# Patient Record
Sex: Male | Born: 1990 | Race: White | Hispanic: No | Marital: Single | Smoking: Current every day smoker
Health system: Southern US, Community
[De-identification: ages and names within clinical notes are randomized; demographics above are authoritative.]

## PROBLEM LIST (undated history)

## (undated) DIAGNOSIS — F1910 Other psychoactive substance abuse, uncomplicated: Secondary | ICD-10-CM

## (undated) DIAGNOSIS — Z9109 Other allergy status, other than to drugs and biological substances: Secondary | ICD-10-CM

## (undated) DIAGNOSIS — F329 Major depressive disorder, single episode, unspecified: Secondary | ICD-10-CM

## (undated) DIAGNOSIS — F32A Depression, unspecified: Secondary | ICD-10-CM

## (undated) DIAGNOSIS — F419 Anxiety disorder, unspecified: Secondary | ICD-10-CM

---
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
## 2012-04-04 ENCOUNTER — Encounter (HOSPITAL_COMMUNITY): Payer: Self-pay | Admitting: *Deleted

## 2012-04-04 ENCOUNTER — Emergency Department (HOSPITAL_COMMUNITY)
Admission: EM | Admit: 2012-04-04 | Discharge: 2012-04-04 | Disposition: A | Discharge status: Home / Self Care | Payer: Medicaid Other | Attending: Emergency Medicine | Admitting: Emergency Medicine

## 2012-04-04 DIAGNOSIS — IMO0002 Reserved for concepts with insufficient information to code with codable children: Secondary | ICD-10-CM | POA: Insufficient documentation

## 2012-04-04 DIAGNOSIS — J309 Allergic rhinitis, unspecified: Secondary | ICD-10-CM | POA: Insufficient documentation

## 2012-04-04 DIAGNOSIS — L819 Disorder of pigmentation, unspecified: Secondary | ICD-10-CM | POA: Insufficient documentation

## 2012-04-04 DIAGNOSIS — L0390 Cellulitis, unspecified: Secondary | ICD-10-CM

## 2012-04-04 DIAGNOSIS — Z87891 Personal history of nicotine dependence: Secondary | ICD-10-CM | POA: Insufficient documentation

## 2012-04-04 DIAGNOSIS — F411 Generalized anxiety disorder: Secondary | ICD-10-CM | POA: Insufficient documentation

## 2012-04-04 MED ORDER — LIDOCAINE HCL (PF) 1 % IJ SOLN
INTRAMUSCULAR | Status: AC
  Administered 2012-04-04: 16:00:00
  Filled 2012-04-04: qty 5

## 2012-04-04 MED ORDER — IBUPROFEN 800 MG PO TABS
800.0000 mg | ORAL_TABLET | Freq: Once | ORAL | Status: AC
  Administered 2012-04-04: 800 mg via ORAL
  Filled 2012-04-04: qty 1

## 2012-04-04 MED ORDER — SULFAMETHOXAZOLE-TMP DS 800-160 MG PO TABS
1.0000 | ORAL_TABLET | Freq: Once | ORAL | Status: AC
  Administered 2012-04-04: 1 via ORAL
  Filled 2012-04-04: qty 1

## 2012-04-04 MED ORDER — SULFAMETHOXAZOLE-TRIMETHOPRIM 800-160 MG PO TABS: 1.0000 | ORAL_TABLET | Freq: Two times a day (BID) | ORAL | Status: DC

## 2012-04-04 MED ORDER — HYDROCODONE-ACETAMINOPHEN 5-325 MG PO TABS
1.0000 | ORAL_TABLET | Freq: Once | ORAL | Status: AC
  Administered 2012-04-04: 1 via ORAL
  Filled 2012-04-04: qty 1

## 2012-04-04 MED ORDER — CEFTRIAXONE SODIUM 1 G IJ SOLR
1.0000 g | Freq: Once | INTRAMUSCULAR | Status: AC
  Administered 2012-04-04: 1 g via INTRAMUSCULAR
  Filled 2012-04-04: qty 10

## 2012-04-04 MED ORDER — HYDROCODONE-ACETAMINOPHEN 5-325 MG PO TABS: 1.0000 | ORAL_TABLET | ORAL | Status: AC | PRN

## 2012-04-04 MED ORDER — AMOXICILLIN 500 MG PO CAPS: ORAL_CAPSULE | ORAL | Status: DC

## 2012-04-04 NOTE — ED Provider Notes (Signed)
History     CSN: [id number]  Arrival date & time 04/04/12  1350   First MD Initiated Contact with Patient 04/04/12 1502      Chief Complaint  Patient presents with  . Cellulitis    (Consider location/radiation/quality/duration/timing/severity/associated sxs/prior treatment) HPI Comments: Pt had a tattoo placed on the left forearm on Oct 11. No developed a red rash around the site, followed by blistering and increase warmth. He now has redness and pain of the area. The redness extends to the upper arm. No fever or chills. Soreness with use of the left arm, but not limited. No other sites of infection.   Past Medical History  Diagnosis Date  . Anxiety   . Environmental allergies     History reviewed. No pertinent past surgical history.  History reviewed. No pertinent family history.  History  Substance Use Topics  . Smoking status: Former Games developer  . Smokeless tobacco: Not on file  . Alcohol Use: No      Review of Systems  Constitutional: Negative for activity change.       All ROS Neg except as noted in HPI  HENT: Negative for nosebleeds and neck pain.   Eyes: Negative for photophobia and discharge.  Respiratory: Negative for cough, shortness of breath and wheezing.   Cardiovascular: Negative for chest pain and palpitations.  Gastrointestinal: Negative for abdominal pain and blood in stool.  Genitourinary: Negative for dysuria, frequency and hematuria.  Musculoskeletal: Negative for back pain and arthralgias.  Skin: Negative.   Neurological: Negative for dizziness, seizures and speech difficulty.  Psychiatric/Behavioral: Negative for hallucinations and confusion. The patient is nervous/anxious.     Allergies  Review of patient's allergies indicates no known allergies.  Home Medications   Current Outpatient Rx  Name Route Sig Dispense Refill  . CETIRIZINE HCL 10 MG PO TABS Oral Take 10 mg by mouth every evening.    . IBUPROFEN 200 MG PO TABS Oral Take 200 mg  by mouth every 6 (six) hours as needed. For pain    . VENLAFAXINE HCL ER 150 MG PO CP24 Oral Take 150 mg by mouth daily.    . AMOXICILLIN 500 MG PO CAPS  2 po bid with food 28 capsule 0  . HYDROCODONE-ACETAMINOPHEN 5-325 MG PO TABS Oral Take 1 tablet by mouth every 4 (four) hours as needed for pain. 20 tablet 0  . SULFAMETHOXAZOLE-TRIMETHOPRIM 800-160 MG PO TABS Oral Take 1 tablet by mouth every 12 (twelve) hours. 14 tablet 0    BP 129/54  Pulse 94  Temp 98.6 F (37 C) (Oral)  Resp 18  Ht 6\' 2"  (1.88 m)  Wt 164 lb (74.39 kg)  BMI 21.06 kg/m2  SpO2 98%  Physical Exam  Nursing note and vitals reviewed. Constitutional: He is oriented to person, place, and time. He appears well-developed and well-nourished.  Non-toxic appearance.  HENT:  Head: Normocephalic.  Right Ear: Tympanic membrane and external ear normal.  Left Ear: Tympanic membrane and external ear normal.  Eyes: EOM and lids are normal. Pupils are equal, round, and reactive to light.  Neck: Normal range of motion. Neck supple. Carotid bruit is not present.  Cardiovascular: Normal rate, regular rhythm, normal heart sounds, intact distal pulses and normal pulses.   Pulmonary/Chest: Breath sounds normal. No respiratory distress.  Abdominal: Soft. Bowel sounds are normal. There is no tenderness. There is no guarding.  Musculoskeletal: Normal range of motion.       Increased redness from the left wrist up  to the antecubital area. Some mild red streaking extending from the antecubital area. The form is warm but not hot. There is no palpable nodes in the axilla. There is full range of motion of the fingers and wrist and elbow.  Lymphadenopathy:       Head (right side): No submandibular adenopathy present.       Head (left side): No submandibular adenopathy present.    He has no cervical adenopathy.  Neurological: He is alert and oriented to person, place, and time. He has normal strength. No cranial nerve deficit or sensory deficit.   Skin: Skin is warm and dry.  Psychiatric: He has a normal mood and affect. His speech is normal.    ED Course  Procedures (including critical care time)  Labs Reviewed - No data to display No results found.   1. Cellulitis       MDM  I have reviewed nursing notes, vital signs, and all appropriate lab and imaging results for this patient. kExam is consistent with cellulitis of the left forearm. Pt advised to use warm soaks to the forearm. Rx for septra, Amoxil and norco given to the patient. Pt to see PCP or return to ED in 3 days for recheck. He is to return sooner if any changes or problem.       Kathie Dike, [place] 04/08/12 [phone]

## 2012-04-04 NOTE — ED Notes (Signed)
Pt has a tattoo to lt forearm  On 10/11.  For last 2 days redness and pain extending to upper arm.

## 2012-04-04 NOTE — ED Notes (Signed)
Pt with new tattoo 9 days ago, also used new body wash Thursday, has redness to arm above tattoo, pt states two pimple like areas noted yesterday, c/o pain to left forearm where tattoo was placed

## 2012-04-08 NOTE — ED Provider Notes (Signed)
Medical screening examination/treatment/procedure(s) were performed by non-physician practitioner and as supervising physician I was immediately available for consultation/collaboration.  Jones Skene, M.D.     Jones Skene, MD 04/08/12 1046

## 2012-05-06 ENCOUNTER — Encounter (HOSPITAL_COMMUNITY): Payer: Self-pay | Admitting: *Deleted

## 2012-05-06 ENCOUNTER — Emergency Department (HOSPITAL_COMMUNITY)
Admission: EM | Admit: 2012-05-06 | Discharge: 2012-05-06 | Disposition: A | Discharge status: Home / Self Care | Payer: Self-pay | Attending: Emergency Medicine | Admitting: Emergency Medicine

## 2012-05-06 DIAGNOSIS — H9209 Otalgia, unspecified ear: Secondary | ICD-10-CM | POA: Insufficient documentation

## 2012-05-06 DIAGNOSIS — Z87891 Personal history of nicotine dependence: Secondary | ICD-10-CM | POA: Insufficient documentation

## 2012-05-06 DIAGNOSIS — J029 Acute pharyngitis, unspecified: Secondary | ICD-10-CM | POA: Insufficient documentation

## 2012-05-06 DIAGNOSIS — F411 Generalized anxiety disorder: Secondary | ICD-10-CM | POA: Insufficient documentation

## 2012-05-06 DIAGNOSIS — Z79899 Other long term (current) drug therapy: Secondary | ICD-10-CM | POA: Insufficient documentation

## 2012-05-06 LAB — RAPID STREP SCREEN (MED CTR MEBANE ONLY): Streptococcus, Group A Screen (Direct): NEGATIVE

## 2012-05-06 MED ORDER — IBUPROFEN 800 MG PO TABS
800.0000 mg | ORAL_TABLET | Freq: Once | ORAL | Status: AC
  Administered 2012-05-06: 800 mg via ORAL
  Filled 2012-05-06: qty 1

## 2012-05-06 NOTE — ED Notes (Signed)
Pt reports being sick for last few days with sore throat and right earache. Hard to swallow at times/

## 2012-05-06 NOTE — ED Notes (Signed)
Pt c/o sore throat, right ear pain for the past few days, painful to swallow.

## 2012-05-06 NOTE — ED Provider Notes (Signed)
History    This chart was scribed for Duane Octave, MD, MD by Smitty Pluck, ED Scribe. The patient was seen in room APA01 and the patient's care was started at 10:01AM.   CSN: [id number]  Arrival date & time 05/06/12  4098      Chief Complaint  Patient presents with  . Sore Throat  . Otalgia    (Consider location/radiation/quality/duration/timing/severity/associated sxs/prior treatment) The history is provided by the patient. No language interpreter was used.   TRENDEN Duane Parker is a 21 y.o. male who presents to the Emergency Department complaining of constant, moderate sore throat onset 1 day ago. He mentions swallowing food and liquids aggravates throat pain. Pt reports having right otalgia onset today. Pt states that he had viral infection over past week but thought symptoms subsided. He reports mild cough. Denies fevers, chills, nausea, vomiting, diarrhea, rash, drainage from right ear and any other pain. Pt has taken tylenol without relief.   Past Medical History  Diagnosis Date  . Anxiety   . Environmental allergies     History reviewed. No pertinent past surgical history.  No family history on file.  History  Substance Use Topics  . Smoking status: Former Games developer  . Smokeless tobacco: Not on file  . Alcohol Use: No      Review of Systems  All other systems reviewed and are negative.  10 Systems reviewed and all are negative for acute change except as noted in the HPI.    Allergies  Review of patient's allergies indicates no known allergies.  Home Medications   Current Outpatient Rx  Name  Route  Sig  Dispense  Refill  . CETIRIZINE HCL 10 MG PO TABS   Oral   Take 10 mg by mouth every evening.         . IBUPROFEN 200 MG PO TABS   Oral   Take 600 mg by mouth every 8 (eight) hours as needed. For pain         . VENLAFAXINE HCL ER 150 MG PO CP24   Oral   Take 150 mg by mouth daily.         . AMOXICILLIN 500 MG PO CAPS      2 po bid with  food   28 capsule   0   . SULFAMETHOXAZOLE-TRIMETHOPRIM 800-160 MG PO TABS   Oral   Take 1 tablet by mouth every 12 (twelve) hours.   14 tablet   0     BP 119/66  Pulse 110  Temp 97.6 F (36.4 C)  Resp 18  SpO2 100%  Physical Exam  Nursing note and vitals reviewed. Constitutional: He is oriented to person, place, and time. He appears well-developed and well-nourished. No distress.  HENT:  Head: Normocephalic and atraumatic.  Right Ear: External ear normal.  Left Ear: External ear normal.  Mouth/Throat: No oropharyngeal exudate.       Mild erythema of oropharynx No tragus or mastoid pain Uvula midline No lymphadenopathy     Neck: Normal range of motion. Neck supple.  Cardiovascular: Normal rate, regular rhythm and normal heart sounds.   Pulmonary/Chest: Effort normal and breath sounds normal. No respiratory distress.  Abdominal: Soft. He exhibits no distension. There is no tenderness. There is no rebound and no guarding.  Musculoskeletal: Normal range of motion.  Neurological: He is alert and oriented to person, place, and time.  Skin: Skin is warm and dry.  Psychiatric: He has a normal mood and affect. His behavior  is normal.    ED Course  Procedures (including critical care time) DIAGNOSTIC STUDIES: Oxygen Saturation is 100% on room air, normal by my interpretation.    COORDINATION OF CARE: 10:04 AM Discussed ED treatment with pt  10:08 AM Ordered:   Medications  ibuprofen (ADVIL,MOTRIN) tablet 800 mg (800 mg Oral Given 05/06/12 1022)   10:45AM Recheck. Discussed lab results with pt. Pt is feeling better.      Labs Reviewed  RAPID STREP SCREEN   No results found.   1. Pharyngitis       MDM  Patient complains of right-sided ear pain and sore throat for the past 2 days. Pain with swallowing but no difficulty. No difficulty breathing. No fever.  No asymmetry or exudates on exam. Tympanic membranes normal. Tolerating PO. Will treat for viral  pharyngitis.      I personally performed the services described in this documentation, which was scribed in my presence. The recorded information has been reviewed and is accurate.  Duane Octave, MD 05/06/12 [phone]

## 2012-08-26 ENCOUNTER — Encounter (HOSPITAL_COMMUNITY): Payer: Self-pay | Admitting: *Deleted

## 2012-08-26 ENCOUNTER — Emergency Department (HOSPITAL_COMMUNITY)
Admission: EM | Admit: 2012-08-26 | Discharge: 2012-08-26 | Disposition: A | Discharge status: Home / Self Care | Payer: Self-pay | Attending: Emergency Medicine | Admitting: Emergency Medicine

## 2012-08-26 DIAGNOSIS — F329 Major depressive disorder, single episode, unspecified: Secondary | ICD-10-CM | POA: Insufficient documentation

## 2012-08-26 DIAGNOSIS — F3289 Other specified depressive episodes: Secondary | ICD-10-CM | POA: Insufficient documentation

## 2012-08-26 DIAGNOSIS — Z79899 Other long term (current) drug therapy: Secondary | ICD-10-CM | POA: Insufficient documentation

## 2012-08-26 DIAGNOSIS — F32A Depression, unspecified: Secondary | ICD-10-CM

## 2012-08-26 DIAGNOSIS — Z87891 Personal history of nicotine dependence: Secondary | ICD-10-CM | POA: Insufficient documentation

## 2012-08-26 DIAGNOSIS — F411 Generalized anxiety disorder: Secondary | ICD-10-CM | POA: Insufficient documentation

## 2012-08-26 HISTORY — DX: Major depressive disorder, single episode, unspecified: F32.9

## 2012-08-26 HISTORY — DX: Depression, unspecified: F32.A

## 2012-08-26 NOTE — ED Notes (Signed)
Out of med for depression.  X 1 week.  Needs med til he can get appt with MD.

## 2012-08-26 NOTE — ED Notes (Signed)
Alert, NAD,  Out of effexor for 1 week, does not have money to get appt for eval with MD for refill.  Parents are here with him.  Denies si or hi

## 2012-08-26 NOTE — ED Provider Notes (Signed)
History     CSN: [id number]  Arrival date & time 08/26/12  1159   First MD Initiated Contact with Patient 08/26/12 1513      Chief Complaint  Patient presents with  . Medication Refill    (Consider location/radiation/quality/duration/timing/severity/associated sxs/prior treatment) HPI Comments: Patient is a 22 year old male who is treated for anxiety and depression. He has been previously being seen by Dr. Milford Cage. Patient presents to the emergency department to obtain a prescription for his depression medication. He has not discussed the need for a prescription with his [facility]. The patient states it has been one to one and a half weeks since his receiving his last dose of medication because of lack of insurance. He denies any suicidal or homicidal ideation. He denies any other psychiatric related issues.   The history is provided by the patient.    Past Medical History  Diagnosis Date  . Anxiety   . Environmental allergies   . Depression     History reviewed. No pertinent past surgical history.  History reviewed. No pertinent family history.  History  Substance Use Topics  . Smoking status: Former Games developer  . Smokeless tobacco: Not on file  . Alcohol Use: No      Review of Systems  Constitutional: Negative for activity change.       All ROS Neg except as noted in HPI  HENT: Negative for nosebleeds and neck pain.   Eyes: Negative for photophobia and discharge.  Respiratory: Negative for cough, shortness of breath and wheezing.   Cardiovascular: Negative for chest pain and palpitations.  Gastrointestinal: Negative for abdominal pain and blood in stool.  Genitourinary: Negative for dysuria, frequency and hematuria.  Musculoskeletal: Negative for back pain and arthralgias.  Skin: Negative.   Neurological: Negative for dizziness, seizures and speech difficulty.  Psychiatric/Behavioral: Negative for hallucinations and confusion. The  patient is nervous/anxious.        Depression    Allergies  Review of patient's allergies indicates no known allergies.  Home Medications   Current Outpatient Rx  Name  Route  Sig  Dispense  Refill  . cetirizine (ZYRTEC) 10 MG tablet   Oral   Take 10 mg by mouth every evening.         Marland Kitchen ibuprofen (ADVIL,MOTRIN) 200 MG tablet   Oral   Take 600 mg by mouth every 8 (eight) hours as needed. For pain         . venlafaxine XR (EFFEXOR-XR) 150 MG 24 hr capsule   Oral   Take 150 mg by mouth daily.           BP 129/77  Pulse 91  Temp(Src) 98.1 F (36.7 C) (Oral)  Ht 6\' 3"  (1.905 m)  Wt 168 lb (76.204 kg)  BMI 21 kg/m2  SpO2 100%  Physical Exam  Nursing note and vitals reviewed. Constitutional: He is oriented to person, place, and time. He appears well-developed and well-nourished.  Non-toxic appearance.  HENT:  Head: Normocephalic.  Right Ear: Tympanic membrane and external ear normal.  Left Ear: Tympanic membrane and external ear normal.  Eyes: EOM and lids are normal. Pupils are equal, round, and reactive to light.  Neck: Normal range of motion. Neck supple. Carotid bruit is not present.  Cardiovascular: Normal rate, regular rhythm, normal heart sounds, intact distal pulses and normal pulses.   Pulmonary/Chest: Breath sounds normal. No respiratory distress.  Abdominal: Soft. Bowel sounds are normal. There is no tenderness. There is no  guarding.  Musculoskeletal: Normal range of motion.  Lymphadenopathy:       Head (right side): No submandibular adenopathy present.       Head (left side): No submandibular adenopathy present.    He has no cervical adenopathy.  Neurological: He is alert and oriented to person, place, and time. He has normal strength. No cranial nerve deficit or sensory deficit.  Skin: Skin is warm and dry.  Psychiatric: He has a normal mood and affect. His speech is normal.  Patient denies suicidal ideation or homicidal ideation or plan.    ED  Course  Procedures (including critical care time)  Labs Reviewed - No data to display No results found.   No diagnosis found.    MDM  I have reviewed nursing notes, vital signs, and all appropriate lab and imaging results for this patient. Patient presents to the emergency department with complaint of being out of his depression medication (Effexor) the patient states he came to the emergency department to see if he can get some samples, or to get a prescription for this particular medication. I've explained to the patient that this type of prescription would need to come from his primary physician or the physicians at day [facility]. The patient denies any suicidal, homicidal ideations. He denies any suicidal or homicidal plans.       Kathie Dike, PA-C 08/26/12 1529

## 2012-08-28 NOTE — ED Provider Notes (Signed)
Medical screening examination/treatment/procedure(s) were performed by non-physician practitioner and as supervising physician I was immediately available for consultation/collaboration.   Laray Anger, DO 08/28/12 1208

## 2012-09-23 ENCOUNTER — Other Ambulatory Visit: Payer: Self-pay

## 2012-09-23 MED ORDER — FLUOXETINE HCL 20 MG PO CAPS: 20.0000 mg | ORAL_CAPSULE | Freq: Every day | ORAL | Status: DC

## 2013-02-22 ENCOUNTER — Emergency Department (HOSPITAL_COMMUNITY)
Admission: EM | Admit: 2013-02-22 | Discharge: 2013-02-23 | Disposition: A | Discharge status: Home / Self Care | Payer: Medicaid Other | Attending: Emergency Medicine | Admitting: Emergency Medicine

## 2013-02-22 ENCOUNTER — Encounter (HOSPITAL_COMMUNITY): Payer: Self-pay | Admitting: Emergency Medicine

## 2013-02-22 DIAGNOSIS — K089 Disorder of teeth and supporting structures, unspecified: Secondary | ICD-10-CM | POA: Insufficient documentation

## 2013-02-22 DIAGNOSIS — F329 Major depressive disorder, single episode, unspecified: Secondary | ICD-10-CM | POA: Insufficient documentation

## 2013-02-22 DIAGNOSIS — Z79899 Other long term (current) drug therapy: Secondary | ICD-10-CM | POA: Insufficient documentation

## 2013-02-22 DIAGNOSIS — R51 Headache: Secondary | ICD-10-CM | POA: Insufficient documentation

## 2013-02-22 DIAGNOSIS — Z87891 Personal history of nicotine dependence: Secondary | ICD-10-CM | POA: Insufficient documentation

## 2013-02-22 DIAGNOSIS — K0889 Other specified disorders of teeth and supporting structures: Secondary | ICD-10-CM

## 2013-02-22 DIAGNOSIS — F3289 Other specified depressive episodes: Secondary | ICD-10-CM | POA: Insufficient documentation

## 2013-02-22 DIAGNOSIS — F411 Generalized anxiety disorder: Secondary | ICD-10-CM | POA: Insufficient documentation

## 2013-02-22 MED ORDER — NAPROXEN 500 MG PO TABS: 500.0000 mg | ORAL_TABLET | Freq: Two times a day (BID) | ORAL | Status: DC

## 2013-02-22 MED ORDER — HYDROCODONE-ACETAMINOPHEN 5-325 MG PO TABS
1.0000 | ORAL_TABLET | Freq: Once | ORAL | Status: AC
  Administered 2013-02-22: 1 via ORAL
  Filled 2013-02-22: qty 1

## 2013-02-22 MED ORDER — PENICILLIN V POTASSIUM 500 MG PO TABS: 500.0000 mg | ORAL_TABLET | Freq: Four times a day (QID) | ORAL | Status: DC

## 2013-02-22 MED ORDER — HYDROCODONE-ACETAMINOPHEN 5-325 MG PO TABS: 1.0000 | ORAL_TABLET | Freq: Four times a day (QID) | ORAL | Status: DC | PRN

## 2013-02-22 MED ORDER — PENICILLIN V POTASSIUM 250 MG PO TABS
500.0000 mg | ORAL_TABLET | Freq: Once | ORAL | Status: AC
  Administered 2013-02-22: 500 mg via ORAL
  Filled 2013-02-22: qty 2

## 2013-02-22 NOTE — ED Provider Notes (Signed)
CSN: [id number]     Arrival date & time 02/22/13  2302 History  This chart was scribed for Shelda Jakes, MD by Greggory Stallion, ED Scribe. This patient was seen in room APA09/APA09 and the patient's care was started at 11:37 PM.   Chief Complaint  Patient presents with  . Dental Pain   Patient is a 22 y.o. male presenting with tooth pain. The history is provided by the patient. No language interpreter was used.  Dental Pain Location:  Upper Upper teeth location:  1/RU 3rd molar and 2/RU 2nd molar Severity:  Moderate Onset quality:  Gradual Duration:  2 days Timing:  Intermittent Progression:  Unchanged Chronicity:  New Relieved by:  None tried Worsened by:  Nothing tried Ineffective treatments:  None tried Associated symptoms: headaches   Associated symptoms: no fever   Risk factors: lack of dental care     HPI Comments: Duane Parker is a 22 y.o. male who presents to the Emergency Department complaining of intermittent right, upper dental pain with associated headache that started 2-3 days ago. Pt denies fever, chills, cough, rhinorrhea, sore throat, CP, SOB, abdominal pain, nausea, emesis, diarrhea, dysuria, ankle swelling, neck pain, back pain, rash, visual disturbance and bleeding easily.    Past Medical History  Diagnosis Date  . Anxiety   . Environmental allergies   . Depression    History reviewed. No pertinent past surgical history. No family history on file. History  Substance Use Topics  . Smoking status: Former Games developer  . Smokeless tobacco: Not on file  . Alcohol Use: No    Review of Systems  Constitutional: Negative for fever and chills.  HENT: Positive for dental problem. Negative for sore throat, rhinorrhea and trouble swallowing.   Eyes: Negative for redness and visual disturbance.  Respiratory: Negative for cough and shortness of breath.   Cardiovascular: Negative for chest pain.  Gastrointestinal: Negative for nausea, vomiting, abdominal pain  and diarrhea.  Genitourinary: Negative for dysuria.  Musculoskeletal: Negative for joint swelling.  Skin: Negative for rash.  Neurological: Positive for headaches.  Hematological: Does not bruise/bleed easily.  Psychiatric/Behavioral: Negative for confusion.    Allergies  Review of patient's allergies indicates no known allergies.  Home Medications   Current Outpatient Rx  Name  Route  Sig  Dispense  Refill  . cetirizine (ZYRTEC) 10 MG tablet   Oral   Take 10 mg by mouth every evening.         Marland Kitchen FLUoxetine (PROZAC) 20 MG capsule   Oral   Take 1 capsule (20 mg total) by mouth daily.   30 capsule   2   . ibuprofen (ADVIL,MOTRIN) 200 MG tablet   Oral   Take 600 mg by mouth every 8 (eight) hours as needed. For pain         . HYDROcodone-acetaminophen (NORCO/VICODIN) 5-325 MG per tablet   Oral   Take 1-2 tablets by mouth every 6 (six) hours as needed for pain.   14 tablet   0   . naproxen (NAPROSYN) 500 MG tablet   Oral   Take 1 tablet (500 mg total) by mouth 2 (two) times daily.   14 tablet   0   . penicillin v potassium (VEETID) 500 MG tablet   Oral   Take 1 tablet (500 mg total) by mouth 4 (four) times daily.   40 tablet   0   . venlafaxine XR (EFFEXOR-XR) 150 MG 24 hr capsule   Oral  Take 150 mg by mouth daily.          BP 152/85  Pulse 94  Temp(Src) 97.9 F (36.6 C) (Oral)  Resp 24  Ht 6\' 2"  (1.88 m)  Wt 170 lb (77.111 kg)  BMI 21.82 kg/m2  SpO2 100%  Physical Exam  Nursing note and vitals reviewed. Constitutional: He is oriented to person, place, and time. He appears well-developed and well-nourished. No distress.  HENT:  Head: Normocephalic and atraumatic.  Last two upper molars have a lot of decay. No obvious gum swelling. No facial swelling.   Eyes: EOM are normal.  Sclera clear.  Neck: Neck supple. No tracheal deviation present.  Cardiovascular: Normal rate.   Pulmonary/Chest: Effort normal. No respiratory distress.   Musculoskeletal: Normal range of motion.  No swelling in ankles.   Lymphadenopathy:    He has no cervical adenopathy.  Neurological: He is alert and oriented to person, place, and time. No cranial nerve deficit.  Skin: Skin is warm and dry.  Psychiatric: He has a normal mood and affect. His behavior is normal.    ED Course  Procedures (including critical care time)  DIAGNOSTIC STUDIES: Oxygen Saturation is 100% on RA, normal by my interpretation.    COORDINATION OF CARE: 11:45 PM-Discussed treatment plan which includes antibiotic and pain medications with pt at bedside and pt agreed to plan.   Labs Review Labs Reviewed - No data to display Imaging Review No results found.  MDM   1. Toothache    Patient with significant decay in the left upper molars. No significant discharge no significant gum swelling. No facial swelling. No adenopathy.    I personally performed the services described in this documentation, which was scribed in my presence. The recorded information has been reviewed and is accurate.    Shelda Jakes, MD 02/22/13 [phone]

## 2013-02-22 NOTE — ED Notes (Signed)
Pt c/o dental pain x 2 days.  

## 2013-02-23 NOTE — ED Notes (Signed)
Pt alert & oriented x4, stable gait. Patient given discharge instructions, paperwork & prescription(s). Patient  instructed to stop at the registration desk to finish any additional paperwork. Patient verbalized understanding. Pt left department w/ no further questions. 

## 2014-01-06 ENCOUNTER — Emergency Department (HOSPITAL_COMMUNITY)
Admission: EM | Admit: 2014-01-06 | Discharge: 2014-01-06 | Disposition: A | Discharge status: Home / Self Care | Payer: Medicaid Other | Attending: Emergency Medicine | Admitting: Emergency Medicine

## 2014-01-06 ENCOUNTER — Encounter (HOSPITAL_COMMUNITY): Payer: Self-pay | Admitting: Emergency Medicine

## 2014-01-06 DIAGNOSIS — Z9109 Other allergy status, other than to drugs and biological substances: Secondary | ICD-10-CM | POA: Insufficient documentation

## 2014-01-06 DIAGNOSIS — Z79899 Other long term (current) drug therapy: Secondary | ICD-10-CM | POA: Insufficient documentation

## 2014-01-06 DIAGNOSIS — K089 Disorder of teeth and supporting structures, unspecified: Secondary | ICD-10-CM | POA: Insufficient documentation

## 2014-01-06 DIAGNOSIS — Z8659 Personal history of other mental and behavioral disorders: Secondary | ICD-10-CM | POA: Insufficient documentation

## 2014-01-06 DIAGNOSIS — K044 Acute apical periodontitis of pulpal origin: Secondary | ICD-10-CM | POA: Insufficient documentation

## 2014-01-06 DIAGNOSIS — Z792 Long term (current) use of antibiotics: Secondary | ICD-10-CM | POA: Insufficient documentation

## 2014-01-06 DIAGNOSIS — Z87891 Personal history of nicotine dependence: Secondary | ICD-10-CM | POA: Insufficient documentation

## 2014-01-06 DIAGNOSIS — K047 Periapical abscess without sinus: Secondary | ICD-10-CM | POA: Insufficient documentation

## 2014-01-06 MED ORDER — AMOXICILLIN 500 MG PO CAPS
500.0000 mg | ORAL_CAPSULE | Freq: Three times a day (TID) | ORAL | Status: AC
Start: 2014-01-06 — End: 2014-01-16

## 2014-01-06 MED ORDER — TRAMADOL HCL 50 MG PO TABS: 50.0000 mg | ORAL_TABLET | Freq: Four times a day (QID) | ORAL | Status: DC | PRN

## 2014-01-06 NOTE — Discharge Instructions (Signed)
Dental Abscess A dental abscess is a collection of infected fluid (pus) from a bacterial infection in the inner part of the tooth (pulp). It usually occurs at the end of the tooth's root.  CAUSES   Severe tooth decay.  Trauma to the tooth that allows bacteria to enter into the pulp, such as a broken or chipped tooth. SYMPTOMS   Severe pain in and around the infected tooth.  Swelling and redness around the abscessed tooth or in the mouth or face.  Tenderness.  Pus drainage.  Bad breath.  Bitter taste in the mouth.  Difficulty swallowing.  Difficulty opening the mouth.  Nausea.  Vomiting.  Chills.  Swollen neck glands. DIAGNOSIS   A medical and dental history will be taken.  An examination will be performed by tapping on the abscessed tooth.  X-rays may be taken of the tooth to identify the abscess. TREATMENT The goal of treatment is to eliminate the infection. You may be prescribed antibiotic medicine to stop the infection from spreading. A root canal may be performed to save the tooth. If the tooth cannot be saved, it may be pulled (extracted) and the abscess may be drained.  HOME CARE INSTRUCTIONS  Only take over-the-counter or prescription medicines for pain, fever, or discomfort as directed by your caregiver.  Rinse your mouth (gargle) often with salt water ( tsp salt in 8 oz [250 ml] of warm water) to relieve pain or swelling.  Do not drive after taking pain medicine (narcotics).  Do not apply heat to the outside of your face.  Return to your dentist for further treatment as directed. SEEK MEDICAL CARE IF:  Your pain is not helped by medicine.  Your pain is getting worse instead of better. SEEK IMMEDIATE MEDICAL CARE IF:  You have a fever or persistent symptoms for more than 2-3 days.  You have a fever and your symptoms suddenly get worse.  You have chills or a very bad headache.  You have problems breathing or swallowing.  You have trouble  opening your mouth.  You have swelling in the neck or around the eye. Document Released: 06/01/2005 Document Revised: 02/24/2012 Document Reviewed: 09/09/2010 [facility] Patient Information 2015 [place], [place]. This information is not intended to replace advice given to you by your health care provider. Make sure you discuss any questions you have with your health care provider.   Complete your entire course of antibiotics as prescribed.  You  may use the ultram for pain relief but do not drive within 4 hours of taking as this will make you drowsy.  Continue taking your ibuprofen (advil).   Avoid applying heat or ice to this abscess area which can worsen your symptoms.  You may use warm salt water swish and spit treatment or half peroxide and water swish and spit after meals to keep this area clean as discussed.  Call the dentist listed above for further management of your symptoms.

## 2014-01-06 NOTE — ED Notes (Signed)
Patient with no complaints at this time. Respirations even and unlabored. Skin warm/dry. Discharge instructions reviewed with patient at this time. Patient given opportunity to voice concerns/ask questions. Patient discharged at this time and left Emergency Department with steady gait.   

## 2014-01-06 NOTE — ED Notes (Signed)
Pt c/o left side dental pain and facial swelling.

## 2014-01-07 NOTE — ED Provider Notes (Signed)
CSN: [id number]     Arrival date & time 01/06/14  1340 History   First MD Initiated Contact with Patient 01/06/14 1518     Chief Complaint  Patient presents with  . Dental Pain     (Consider location/radiation/quality/duration/timing/severity/associated sxs/prior Treatment) The history is provided by the patient.   [name] is a 23 y.o. male presenting with dental pain and gingival swelling.  He reports having a fractured tooth which is chronic,  But fractured further about 1 month ago. He is controlling the pain fairly with ibuprofen but is concerned about developing gingival swelling around the tooth.  He has not contacted a dentist due to lack of insurance.  He denies fevers or chills and has been able to eat and drink,  Denies throat pain or swelling.     Past Medical History  Diagnosis Date  . Anxiety   . Environmental allergies   . Depression    History reviewed. No pertinent past surgical history. History reviewed. No pertinent family history. History  Substance Use Topics  . Smoking status: Former Games developermoker  . Smokeless tobacco: Not on file  . Alcohol Use: No    Review of Systems  Constitutional: Negative for fever.  HENT: Positive for dental problem. Negative for facial swelling and sore throat.   Respiratory: Negative for shortness of breath.   Musculoskeletal: Negative for neck pain and neck stiffness.      Allergies  Review of patient's allergies indicates no known allergies.  Home Medications   Prior to Admission medications   Medication Sig Start Date End Date Taking? Authorizing Provider  cetirizine (ZYRTEC) 10 MG tablet Take 10 mg by mouth every evening.   Yes Historical Provider, MD  ibuprofen (ADVIL,MOTRIN) 200 MG tablet Take 600 mg by mouth every 8 (eight) hours as needed. For pain   Yes Historical Provider, MD  amoxicillin (AMOXIL) 500 MG capsule Take 1 capsule (500 mg total) by mouth 3 (three) times daily. 01/06/14 01/16/14  Burgess AmorJulie Emali Heyward, PA-C    traMADol (ULTRAM) 50 MG tablet Take 1 tablet (50 mg total) by mouth every 6 (six) hours as needed. 01/06/14   Burgess AmorJulie Tynan Boesel, PA-C   BP 117/64  Pulse 84  Temp(Src) 98.1 F (36.7 C) (Oral)  Resp 18  Ht 6\' 2"  (1.88 m)  SpO2 99% Physical Exam  Constitutional: He is oriented to person, place, and time. He appears well-developed and well-nourished. No distress.  HENT:  Head: Normocephalic and atraumatic.  Right Ear: Tympanic membrane and external ear normal.  Left Ear: Tympanic membrane and external ear normal.  Mouth/Throat: Oropharynx is clear and moist and mucous membranes are normal. No oral lesions. No trismus in the jaw. Dental abscesses present.    Eyes: Conjunctivae are normal.  Neck: Normal range of motion. Neck supple.  Cardiovascular: Normal rate and normal heart sounds.   Pulmonary/Chest: Effort normal.  Musculoskeletal: Normal range of motion.  Lymphadenopathy:    He has no cervical adenopathy.  Neurological: He is alert and oriented to person, place, and time.  Skin: Skin is warm and dry. No erythema.  Psychiatric: He has a normal mood and affect.    ED Course  Procedures (including critical care time) Labs Review Labs Reviewed - No data to display  Imaging Review No results found.   EKG Interpretation None      MDM   Final diagnoses:  Dental infection    Amoxil,  Tramadol,  Continue ibuprofen.  Referral to dentist given.  The patient  appears reasonably screened and/or stabilized for discharge and I doubt any other medical condition or other [facility] requiring further screening, evaluation, or treatment in the ED at this time prior to discharge.     Burgess Amor, PA-C 01/07/14 [phone]

## 2014-01-09 NOTE — ED Provider Notes (Signed)
Medical screening examination/treatment/procedure(s) were performed by non-physician practitioner and as supervising physician I was immediately available for consultation/collaboration.   EKG Interpretation None       Donnetta HutchingBrian Ledarius Leeson, MD 01/09/14 1351

## 2014-01-22 ENCOUNTER — Encounter (HOSPITAL_COMMUNITY): Payer: Self-pay | Admitting: Emergency Medicine

## 2014-01-22 ENCOUNTER — Emergency Department (HOSPITAL_COMMUNITY)
Admission: EM | Admit: 2014-01-22 | Discharge: 2014-01-22 | Disposition: A | Discharge status: Home / Self Care | Payer: Medicaid Other | Attending: Emergency Medicine | Admitting: Emergency Medicine

## 2014-01-22 DIAGNOSIS — J3489 Other specified disorders of nose and nasal sinuses: Secondary | ICD-10-CM | POA: Insufficient documentation

## 2014-01-22 DIAGNOSIS — J309 Allergic rhinitis, unspecified: Secondary | ICD-10-CM | POA: Insufficient documentation

## 2014-01-22 DIAGNOSIS — Z79899 Other long term (current) drug therapy: Secondary | ICD-10-CM | POA: Insufficient documentation

## 2014-01-22 DIAGNOSIS — Z87891 Personal history of nicotine dependence: Secondary | ICD-10-CM | POA: Insufficient documentation

## 2014-01-22 DIAGNOSIS — Z8659 Personal history of other mental and behavioral disorders: Secondary | ICD-10-CM | POA: Insufficient documentation

## 2014-01-22 DIAGNOSIS — R112 Nausea with vomiting, unspecified: Secondary | ICD-10-CM | POA: Insufficient documentation

## 2014-01-22 DIAGNOSIS — L299 Pruritus, unspecified: Secondary | ICD-10-CM | POA: Insufficient documentation

## 2014-01-22 MED ORDER — CETIRIZINE-PSEUDOEPHEDRINE ER 5-120 MG PO TB12: 1.0000 | ORAL_TABLET | Freq: Two times a day (BID) | ORAL | Status: AC | PRN

## 2014-01-22 NOTE — ED Provider Notes (Signed)
CSN: [id number]     Arrival date & time 01/22/14  1042 History  This chart was scribed for a non-physician practitioner, Burgess Amor, PA-C, working with Joya Gaskins, MD by Julian Hy, ED Scribe. The patient was seen in APFT24/APFT24. The patient's care was started at 11:28 AM.    Chief Complaint  Patient presents with  . URI   The history is provided by the patient. No language interpreter was used.   HPI Comments: Duane Parker is a 23 y.o. male who presents to the Emergency Department complaining of new, moderate, gradually worsening nasal congestion onset 4 days ago. Pt reports associated symptoms of post-nasal drip, rhinorrhea, sneezing, itchy eyes, itchy throat, congestion, nausea with an episode of post tussive emesis yesterday. Pt reports his emesis may be caused by his post-nasal drip. Pt reports he has used Afrin and Theraflu for his nasal congestion and sore throat with minimal relief. Pt reports he last used Afrin and Theraflu this morning. Pt reports having sick contacts.  Pt reports he has not taken his normal dosages of Zyrtec for his chronic allergies. Pt reports he last took Zyrtec 5 days ago, pt reports some drowsiness with Zyrtec. Pt denies fever, cough, nosebleeds or purulent or bloody nasal drainage.  Past Medical History  Diagnosis Date  . Anxiety   . Environmental allergies   . Depression    History reviewed. No pertinent past surgical history. No family history on file. History  Substance Use Topics  . Smoking status: Former Games developer  . Smokeless tobacco: Not on file  . Alcohol Use: No    Review of Systems  Constitutional: Negative for chills.  HENT: Positive for congestion, postnasal drip and sore throat. Negative for nosebleeds, sinus pressure, trouble swallowing and voice change.   Eyes: Positive for itching. Negative for discharge.  Respiratory: Positive for cough. Negative for shortness of breath and stridor.   Cardiovascular: Negative for chest  pain.  Gastrointestinal: Positive for nausea and vomiting. Negative for abdominal pain.  Genitourinary: Negative.   Skin: Negative for rash.  All other systems reviewed and are negative.  Allergies  Review of patient's allergies indicates no known allergies.  Home Medications   Prior to Admission medications   Medication Sig Start Date End Date Taking? Authorizing Provider  ibuprofen (ADVIL,MOTRIN) 200 MG tablet Take 600 mg by mouth every 8 (eight) hours as needed. For pain   Yes Historical Provider, MD  cetirizine-pseudoephedrine (ZYRTEC-D) 5-120 MG per tablet Take 1 tablet by mouth 2 (two) times daily as needed for allergies. 01/22/14   Burgess Amor, PA-C   Triage Vitals: BP 130/69  Pulse 83  Temp(Src) 98.1 F (36.7 C) (Oral)  Resp 18  Ht 6\' 2"  (1.88 m)  Wt 150 lb (68.04 kg)  BMI 19.25 kg/m2  SpO2 100% Physical Exam  Nursing note and vitals reviewed. Constitutional: He is oriented to person, place, and time. He appears well-developed and well-nourished.  HENT:  Head: Normocephalic and atraumatic.  Right Ear: Tympanic membrane and ear canal normal.  Left Ear: Tympanic membrane and ear canal normal.  Mouth/Throat: Uvula is midline, oropharynx is clear and moist and mucous membranes are normal. No oropharyngeal exudate, posterior oropharyngeal edema, posterior oropharyngeal erythema or tonsillar abscesses.  No nasal congestion. Pharynx is normal.   Eyes: Conjunctivae are normal.  Conjunctiva have mild increased erythema.   Cardiovascular: Normal rate and normal heart sounds.   Pulmonary/Chest: Effort normal. No respiratory distress. He has no wheezes. He has no rales.  Abdominal: Soft. There is no tenderness.  Musculoskeletal: Normal range of motion.  Neurological: He is alert and oriented to person, place, and time.  Skin: Skin is warm and dry. No rash noted.  Psychiatric: He has a normal mood and affect.    ED Course  Procedures (including critical care time) DIAGNOSTIC  STUDIES: Oxygen Saturation is 100% on RA, normal by my interpretation.    COORDINATION OF CARE: 11:37 AM- Patient informed of current plan for treatment and evaluation and agrees with plan at this time.  Labs Review Labs Reviewed - No data to display  Imaging Review No results found.   EKG Interpretation None      MDM   Final diagnoses:  Allergic rhinitis, unspecified allergic rhinitis type    Zyrtec d prescribed.  Exam c/w allergies.  Advised increasing fluid intake when taking antihistamines.  The patient appears reasonably screened and/or stabilized for discharge and I doubt any other medical condition or other [facility] requiring further screening, evaluation, or treatment in the ED at this time prior to discharge.   I personally performed the services described in this documentation, which was scribed in my presence. The recorded information has been reviewed and is accurate.   Burgess AmorJulie Sianne Tejada, PA-C 01/24/14 [phone]

## 2014-01-22 NOTE — ED Notes (Signed)
Alert, NAD, Pt says nasal congestion.  Has not taken any meds.for sx.  "I should have taken my zyrtec."

## 2014-01-22 NOTE — Discharge Instructions (Signed)

## 2014-01-22 NOTE — ED Notes (Signed)
Pt c/o cold symptoms and nausea for past few days.

## 2014-01-24 NOTE — ED Provider Notes (Signed)
Medical screening examination/treatment/procedure(s) were performed by non-physician practitioner and as supervising physician I was immediately available for consultation/collaboration.   EKG Interpretation None        Joya Gaskinsonald W Jacen Carlini, MD 01/24/14 [phone]

## 2014-06-30 ENCOUNTER — Encounter (HOSPITAL_COMMUNITY): Payer: Self-pay | Admitting: *Deleted

## 2014-06-30 ENCOUNTER — Emergency Department (HOSPITAL_COMMUNITY)
Admission: EM | Admit: 2014-06-30 | Discharge: 2014-06-30 | Disposition: A | Discharge status: Home / Self Care | Payer: Medicaid Other | Attending: Emergency Medicine | Admitting: Emergency Medicine

## 2014-06-30 DIAGNOSIS — Z87891 Personal history of nicotine dependence: Secondary | ICD-10-CM | POA: Insufficient documentation

## 2014-06-30 DIAGNOSIS — Z8659 Personal history of other mental and behavioral disorders: Secondary | ICD-10-CM | POA: Insufficient documentation

## 2014-06-30 DIAGNOSIS — R05 Cough: Secondary | ICD-10-CM | POA: Insufficient documentation

## 2014-06-30 DIAGNOSIS — M791 Myalgia: Secondary | ICD-10-CM | POA: Insufficient documentation

## 2014-06-30 DIAGNOSIS — R51 Headache: Secondary | ICD-10-CM | POA: Insufficient documentation

## 2014-06-30 DIAGNOSIS — R110 Nausea: Secondary | ICD-10-CM | POA: Insufficient documentation

## 2014-06-30 LAB — CBC WITH DIFFERENTIAL/PLATELET
Basophils Absolute: 0 10*3/uL (ref 0.0–0.1)
Basophils Relative: 1 % (ref 0–1)
Eosinophils Absolute: 0.1 10*3/uL (ref 0.0–0.7)
Eosinophils Relative: 2 % (ref 0–5)
HCT: 41.6 % (ref 39.0–52.0)
Hemoglobin: 14 g/dL (ref 13.0–17.0)
Lymphocytes Relative: 41 % (ref 12–46)
Lymphs Abs: 2.2 10*3/uL (ref 0.7–4.0)
MCH: 29 pg (ref 26.0–34.0)
MCHC: 33.7 g/dL (ref 30.0–36.0)
MCV: 86.3 fL (ref 78.0–100.0)
Monocytes Absolute: 0.3 10*3/uL (ref 0.1–1.0)
Monocytes Relative: 6 % (ref 3–12)
Neutro Abs: 2.6 10*3/uL (ref 1.7–7.7)
Neutrophils Relative %: 50 % (ref 43–77)
Platelets: 165 10*3/uL (ref 150–400)
RBC: 4.82 MIL/uL (ref 4.22–5.81)
RDW: 12.5 % (ref 11.5–15.5)
WBC: 5.3 10*3/uL (ref 4.0–10.5)

## 2014-06-30 LAB — URINALYSIS, ROUTINE W REFLEX MICROSCOPIC
Bilirubin Urine: NEGATIVE
Glucose, UA: NEGATIVE mg/dL
Hgb urine dipstick: NEGATIVE
Ketones, ur: NEGATIVE mg/dL
Leukocytes, UA: NEGATIVE
Nitrite: NEGATIVE
Protein, ur: NEGATIVE mg/dL
Specific Gravity, Urine: 1.01 (ref 1.005–1.030)
Urobilinogen, UA: 0.2 mg/dL (ref 0.0–1.0)
pH: 7.5 (ref 5.0–8.0)

## 2014-06-30 LAB — BASIC METABOLIC PANEL
Anion gap: 5 (ref 5–15)
BUN: 12 mg/dL (ref 6–23)
CO2: 30 mmol/L (ref 19–32)
Calcium: 8.7 mg/dL (ref 8.4–10.5)
Chloride: 108 mEq/L (ref 96–112)
Creatinine, Ser: 0.93 mg/dL (ref 0.50–1.35)
GFR calc Af Amer: >90 mL/min (ref >90)
GFR calc non Af Amer: >90 mL/min (ref >90)
Glucose, Bld: 89 mg/dL (ref 70–99)
Potassium: 3.6 mmol/L (ref 3.5–5.1)
Sodium: 143 mmol/L (ref 135–145)

## 2014-06-30 MED ORDER — ONDANSETRON HCL 4 MG PO TABS: 4.0000 mg | ORAL_TABLET | Freq: Four times a day (QID) | ORAL | Status: AC

## 2014-06-30 NOTE — Discharge Instructions (Signed)
Your urine and blood work tonight are normal. You may have a viral illness causing your nausea. Take the medication as needed for nausea. Treat your fever with tylenol or ibuprofen. Return as needed for worsening symptoms.   Nausea, Adult Nausea is the feeling that you have an upset stomach or have to vomit. Nausea by itself is not likely a serious concern, but it may be an early sign of more serious medical problems. As nausea gets worse, it can lead to vomiting. If vomiting develops, there is the risk of dehydration.  CAUSES   Viral infections.  Food poisoning.  Medicines.  Pregnancy.  Motion sickness.  Migraine headaches.  Emotional distress.  Severe pain from any source.  Alcohol intoxication. HOME CARE INSTRUCTIONS  Get plenty of rest.  Ask your caregiver about specific rehydration instructions.  Eat small amounts of food and sip liquids more often.  Take all medicines as told by your caregiver. SEEK MEDICAL CARE IF:  You have not improved after 2 days, or you get worse.  You have a headache. SEEK IMMEDIATE MEDICAL CARE IF:   You have a fever.  You faint.  You keep vomiting or have blood in your vomit.  You are extremely weak or dehydrated.  You have dark or bloody stools.  You have severe chest or abdominal pain. MAKE SURE YOU:  Understand these instructions.  Will watch your condition.  Will get help right away if you are not doing well or get worse. Document Released: 07/09/2004 Document Revised: 02/24/2012 Document Reviewed: 02/11/2011 [facility] Patient Information 2015 [place], [place]. This information is not intended to replace advice given to you by your health care provider. Make sure you discuss any questions you have with your health care provider.

## 2014-06-30 NOTE — ED Notes (Addendum)
Pt c/o c/o fever, nausea, and headache x 1 week. Pt here to get a check up.

## 2014-06-30 NOTE — ED Provider Notes (Signed)
CSN: [id number]     Arrival date & time 06/30/14  2006 History   First MD Initiated Contact with Patient 06/30/14 2017     Chief Complaint  Patient presents with  . Fever     (Consider location/radiation/quality/duration/timing/severity/associated sxs/prior Treatment) Patient is a 24 y.o. male presenting with fever. The history is provided by the patient.  Fever Max temp prior to arrival:  100 Temp source:  Oral Severity:  Mild Onset quality:  Gradual Duration:  7 days Timing:  Intermittent Chronicity:  New Relieved by:  None tried Worsened by:  Nothing tried Ineffective treatments:  None tried Associated symptoms: cough (dry), headaches, myalgias and nausea   Associated symptoms: no confusion, no congestion, no diarrhea, no dysuria, no ear pain, no rash, no sore throat and no vomiting   Risk factors: sick contacts    Duane Parker is a 24 y.o. male who presents to the ED with fever, headache and nausea x one week. He has been around friends with similar symptoms. He complains of feeling tired. He does not have a PCP.   Past Medical History  Diagnosis Date  . Anxiety   . Environmental allergies   . Depression    History reviewed. No pertinent past surgical history. History reviewed. No pertinent family history. History  Substance Use Topics  . Smoking status: Former Games developermoker  . Smokeless tobacco: Not on file  . Alcohol Use: No    Review of Systems  Constitutional: Positive for fever.  HENT: Negative for congestion, ear pain, sneezing and sore throat.   Respiratory: Positive for cough (dry). Negative for wheezing.   Gastrointestinal: Positive for nausea. Negative for vomiting, abdominal pain and diarrhea.  Genitourinary: Negative for dysuria, urgency and frequency.  Musculoskeletal: Positive for myalgias. Negative for back pain.  Skin: Negative for rash.  Neurological: Positive for headaches. Negative for syncope and light-headedness.  Psychiatric/Behavioral:  Negative for confusion. Nervous/anxious: hx of anxiety.       Allergies  Review of patient's allergies indicates no known allergies.  Home Medications   Prior to Admission medications   Medication Sig Start Date End Date Taking? Authorizing Provider  ibuprofen (ADVIL,MOTRIN) 200 MG tablet Take 400 mg by mouth every 8 (eight) hours as needed for fever or mild pain. For pain   Yes Historical Provider, MD  oxymetazoline (AFRIN) 0.05 % nasal spray Place 1 spray into both nostrils daily as needed for congestion.   Yes Historical Provider, MD  cetirizine-pseudoephedrine (ZYRTEC-D) 5-120 MG per tablet Take 1 tablet by mouth 2 (two) times daily as needed for allergies. Patient not taking: Reported on 06/30/2014 01/22/14   Burgess AmorJulie Idol, PA-C  ondansetron (ZOFRAN) 4 MG tablet Take 1 tablet (4 mg total) by mouth every 6 (six) hours. 06/30/14   Crystale Giannattasio Orlene OchM Lavan Imes, NP   BP 122/72 mmHg  Pulse 85  Temp(Src) 97.9 F (36.6 C) (Oral)  Resp 20  Ht 6\' 2"  (1.88 m)  Wt 150 lb (68.04 kg)  BMI 19.25 kg/m2  SpO2 100% Physical Exam  Constitutional: He is oriented to person, place, and time. He appears well-developed and well-nourished. No distress.  HENT:  Head: Normocephalic and atraumatic.  Right Ear: Tympanic membrane normal.  Left Ear: Tympanic membrane normal.  Nose: Nose normal.  Mouth/Throat: Uvula is midline, oropharynx is clear and moist and mucous membranes are normal.  Eyes: Conjunctivae and EOM are normal. Pupils are equal, round, and reactive to light.  Neck: Normal range of motion. Neck supple.  Cardiovascular: Normal rate  and regular rhythm.   Pulmonary/Chest: Effort normal and breath sounds normal. No respiratory distress.  Abdominal: Soft. Bowel sounds are normal. There is no tenderness.  Musculoskeletal: Normal range of motion.  Lymphadenopathy:    He has no cervical adenopathy.  Neurological: He is alert and oriented to person, place, and time. No cranial nerve deficit.  Skin: Skin is warm  and dry.  Psychiatric: He has a normal mood and affect. His behavior is normal.  Nursing note and vitals reviewed.   ED Course  Procedures  Results for orders placed or performed during the hospital encounter of 06/30/14 (from the past 24 hour(s))  Urinalysis, Routine w reflex microscopic     Status: None   Collection Time: 06/30/14  8:48 PM  Result Value Ref Range   Color, Urine YELLOW YELLOW   APPearance CLEAR CLEAR   Specific Gravity, Urine 1.010 1.005 - 1.030   pH 7.5 5.0 - 8.0   Glucose, UA NEGATIVE NEGATIVE mg/dL   Hgb urine dipstick NEGATIVE NEGATIVE   Bilirubin Urine NEGATIVE NEGATIVE   Ketones, ur NEGATIVE NEGATIVE mg/dL   Protein, ur NEGATIVE NEGATIVE mg/dL   Urobilinogen, UA 0.2 0.0 - 1.0 mg/dL   Nitrite NEGATIVE NEGATIVE   Leukocytes, UA NEGATIVE NEGATIVE  CBC with Differential     Status: None   Collection Time: 06/30/14  9:08 PM  Result Value Ref Range   WBC 5.3 4.0 - 10.5 K/uL   RBC 4.82 4.22 - 5.81 MIL/uL   Hemoglobin 14.0 13.0 - 17.0 g/dL   HCT 40.9 81.1 - 91.4 %   MCV 86.3 78.0 - 100.0 fL   MCH 29.0 26.0 - 34.0 pg   MCHC 33.7 30.0 - 36.0 g/dL   RDW 78.2 95.6 - 21.3 %   Platelets 165 150 - 400 K/uL   Neutrophils Relative % 50 43 - 77 %   Neutro Abs 2.6 1.7 - 7.7 K/uL   Lymphocytes Relative 41 12 - 46 %   Lymphs Abs 2.2 0.7 - 4.0 K/uL   Monocytes Relative 6 3 - 12 %   Monocytes Absolute 0.3 0.1 - 1.0 K/uL   Eosinophils Relative 2 0 - 5 %   Eosinophils Absolute 0.1 0.0 - 0.7 K/uL   Basophils Relative 1 0 - 1 %   Basophils Absolute 0.0 0.0 - 0.1 K/uL  Basic metabolic panel     Status: None   Collection Time: 06/30/14  9:08 PM  Result Value Ref Range   Sodium 143 135 - 145 mmol/L   Potassium 3.6 3.5 - 5.1 mmol/L   Chloride 108 96 - 112 mEq/L   CO2 30 19 - 32 mmol/L   Glucose, Bld 89 70 - 99 mg/dL   BUN 12 6 - 23 mg/dL   Creatinine, Ser 0.86 0.50 - 1.35 mg/dL   Calcium 8.7 8.4 - 57.8 mg/dL   GFR calc non Af Amer >90 >90 mL/min   GFR calc Af Amer  >90 >90 mL/min   Anion gap 5 5 - 15     MDM  24 y.o. male with nausea x one week, low grade fever off. Will treat nausea with Zofran and he will return as needed for worsening symptoms. Stable for discharge without fever, normal labs, no signs of dehydration.  Final diagnoses:  Nausea      Janne Napoleon, NP 06/30/14 2142  Gerhard Munch, MD 06/30/14 2205

## 2018-08-22 ENCOUNTER — Other Ambulatory Visit: Payer: Self-pay

## 2018-08-22 ENCOUNTER — Emergency Department (HOSPITAL_COMMUNITY)
Admission: EM | Admit: 2018-08-22 | Discharge: 2018-08-22 | Disposition: A | Discharge status: Home / Self Care | Payer: Self-pay | Attending: Emergency Medicine | Admitting: Emergency Medicine

## 2018-08-22 ENCOUNTER — Encounter (HOSPITAL_COMMUNITY): Payer: Self-pay | Admitting: Emergency Medicine

## 2018-08-22 DIAGNOSIS — Z87891 Personal history of nicotine dependence: Secondary | ICD-10-CM | POA: Insufficient documentation

## 2018-08-22 DIAGNOSIS — F419 Anxiety disorder, unspecified: Secondary | ICD-10-CM | POA: Insufficient documentation

## 2018-08-22 DIAGNOSIS — Z79899 Other long term (current) drug therapy: Secondary | ICD-10-CM | POA: Insufficient documentation

## 2018-08-22 MED ORDER — VENLAFAXINE HCL ER 75 MG PO CP24: 75.0000 mg | ORAL_CAPSULE | Freq: Every day | ORAL | 0 refills | Status: AC

## 2018-08-22 MED ORDER — LORAZEPAM 1 MG PO TABS
1.0000 mg | ORAL_TABLET | Freq: Once | ORAL | Status: AC
  Administered 2018-08-22: 1 mg via ORAL
  Filled 2018-08-22: qty 1

## 2018-08-22 NOTE — Discharge Instructions (Addendum)
Contact one of the clinics listed to arrange follow-up and establish primary care.  Return to Er for any worsening symtpoms

## 2018-08-22 NOTE — ED Notes (Signed)
Pt has driver in room.

## 2018-08-22 NOTE — ED Provider Notes (Signed)
[facility] EMERGENCY DEPARTMENT Provider Note   CSN: [id number] Arrival date & time: 08/22/18  1955    History   Chief Complaint Chief Complaint  Patient presents with  . Anxiety    HPI Ceasar MonsDustin E Bassford is a 28 y.o. male.     HPI   Ceasar MonsDustin E Lehew is a 28 y.o. male who presents to the Emergency Department complaining of increased anxiety and chest pain.  He reports a history of generalized anxiety and endorses increased stress at home recently.  He states his stress has been waxing and waning for several months.  He states he developed a sharp pain across his upper chest shortly before arrival.  He states he has been on antianxiety medication and Effexor in the past, but has ran out of his medications sometime ago.  He denies headache or dizziness, shortness of breath, fever or chills.  He also denies history of drug use, suicidal or homicidal thoughts or plans.  He states that he does not currently have healthcare insurance and has been unable to afford to see a doctor.    Past Medical History:  Diagnosis Date  . Anxiety   . Depression   . Environmental allergies     There are no active problems to display for this patient.   History reviewed. No pertinent surgical history.     Home Medications    Prior to Admission medications   Medication Sig Start Date End Date Taking? Authorizing Provider  cetirizine-pseudoephedrine (ZYRTEC-D) 5-120 MG per tablet Take 1 tablet by mouth 2 (two) times daily as needed for allergies. Patient not taking: Reported on 06/30/2014 01/22/14   Burgess AmorIdol, Julie, PA-C  ibuprofen (ADVIL,MOTRIN) 200 MG tablet Take 400 mg by mouth every 8 (eight) hours as needed for fever or mild pain. For pain    [provider]  ondansetron (ZOFRAN) 4 MG tablet Take 1 tablet (4 mg total) by mouth every 6 (six) hours. 06/30/14   Janne NapoleonNeese, Hope M, NP  oxymetazoline (AFRIN) 0.05 % nasal spray Place 1 spray into both nostrils daily as needed for congestion.     [provider]  cetirizine (ZYRTEC) 10 MG tablet Take 10 mg by mouth every evening.  01/22/14  [provider]    Family History No family history on file.  Social History Social History   Tobacco Use  . Smoking status: Former Games developermoker  . Smokeless tobacco: Never Used  Substance Use Topics  . Alcohol use: No  . Drug use: No     Allergies   Patient has no known allergies.   Review of Systems Review of Systems  Constitutional: Negative for appetite change, chills and fever.  Eyes: Negative for visual disturbance.  Respiratory: Negative for cough and shortness of breath.   Cardiovascular: Positive for chest pain.  Gastrointestinal: Negative for abdominal pain, nausea and vomiting.  Genitourinary: Negative for dysuria, frequency and hematuria.  Musculoskeletal: Negative for arthralgias, back pain, neck pain and neck stiffness.  Skin: Negative for rash.  Neurological: Negative for dizziness, seizures, syncope, weakness and headaches.  Hematological: Does not bruise/bleed easily.  Psychiatric/Behavioral: Negative for confusion, decreased concentration, hallucinations and suicidal ideas. The patient is nervous/anxious.      Physical Exam Updated Vital Signs BP 130/78 (BP Location: Right Arm)   Pulse 95   Temp 98.2 F (36.8 C) (Oral)   Ht 6\' 2"  (1.88 m)   Wt 79.4 kg   SpO2 100%   BMI 22.47 kg/m   Physical Exam Vitals  signs and nursing note reviewed.  Constitutional:      General: He is not in acute distress. HENT:     Head: Atraumatic.     Mouth/Throat:     Mouth: Mucous membranes are moist.     Pharynx: Oropharynx is clear.  Eyes:     Pupils: Pupils are equal, round, and reactive to light.  Neck:     Musculoskeletal: Normal range of motion.  Cardiovascular:     Rate and Rhythm: Normal rate and regular rhythm.     Pulses: Normal pulses.     Heart sounds: Normal heart sounds.  Pulmonary:     Effort: Pulmonary effort is normal.     Breath  sounds: Normal breath sounds.  Chest:     Chest wall: No tenderness.  Abdominal:     Palpations: Abdomen is soft.     Tenderness: There is no abdominal tenderness. There is no guarding or rebound.  Musculoskeletal: Normal range of motion.        General: No tenderness.  Lymphadenopathy:     Cervical: No cervical adenopathy.  Skin:    General: Skin is warm.     Findings: No rash.  Neurological:     General: No focal deficit present.     Mental Status: He is alert and oriented to person, place, and time.  Psychiatric:        Attention and Perception: Attention normal. He does not perceive auditory or visual hallucinations.        Mood and Affect: Mood is anxious.        Speech: Speech normal.        Behavior: Behavior normal. Behavior is cooperative.        Thought Content: Thought content is not delusional. Thought content does not include homicidal or suicidal ideation. Thought content does not include homicidal or suicidal plan.        Cognition and Memory: Cognition normal.        Judgment: Judgment normal.      ED Treatments / Results  Labs (all labs ordered are listed, but only abnormal results are displayed) Labs Reviewed - No data to display  EKG EKG Interpretation  Date/Time:  Monday August 22 2018 21:27:17 EDT Ventricular Rate:  78 PR Interval:    QRS Duration: 85 QT Interval:  359 QTC Calculation: 409 R Axis:   80 Text Interpretation:  Sinus rhythm ST elev, probable normal early repol pattern No old tracing to compare Confirmed by Eber Hong ([id number]) on 08/22/2018 9:30:06 PM   Radiology No results found.  Procedures Procedures (including critical care time)  Medications Ordered in ED Medications - No data to display   Initial Impression / Assessment and Plan / ED Course  I have reviewed the triage vital signs and the nursing notes.  Pertinent labs & imaging results that were available during my care of the patient were reviewed by me and considered  in my medical decision making (see chart for details).       Patient with history of depression and anxiety.  He has been without his medications for some time.  No SI or HI thought or plan.  Doubt ACS, no clinical concern for PE.  Vitals reassuring.  Chest pain felt to be secondary to his anxiety.  He is requesting refill of his medication and assistance with establishing PCP.  I feel that he is appropriate for discharge home and referral information provided.  I will also provide a short course of  his Effexor with the understanding that he will arrange outpatient follow-up.  He agrees to plan and return precautions were also discussed.  Final Clinical Impressions(s) / ED Diagnoses   Final diagnoses:  Anxiety    ED Discharge Orders    None       Pauline Aus, PA-C 08/24/18 1433    Eber Hong, MD 08/26/18 1112

## 2018-08-22 NOTE — ED Triage Notes (Addendum)
Pt here with c/o Anxiety that started 4-5 hrs ago. Pt states he was on medication for anxiety but has run out. Denies any recent events that have contributed to his anxiety.

## 2020-04-29 ENCOUNTER — Emergency Department (HOSPITAL_COMMUNITY)
Admission: EM | Admit: 2020-04-29 | Discharge: 2020-04-29 | Disposition: A | Discharge status: Home / Self Care | Payer: Self-pay | Attending: Emergency Medicine | Admitting: Emergency Medicine

## 2020-04-29 ENCOUNTER — Other Ambulatory Visit: Payer: Self-pay

## 2020-04-29 ENCOUNTER — Encounter (HOSPITAL_COMMUNITY): Payer: Self-pay

## 2020-04-29 DIAGNOSIS — F17210 Nicotine dependence, cigarettes, uncomplicated: Secondary | ICD-10-CM | POA: Insufficient documentation

## 2020-04-29 DIAGNOSIS — R197 Diarrhea, unspecified: Secondary | ICD-10-CM | POA: Insufficient documentation

## 2020-04-29 DIAGNOSIS — F419 Anxiety disorder, unspecified: Secondary | ICD-10-CM | POA: Insufficient documentation

## 2020-04-29 DIAGNOSIS — R112 Nausea with vomiting, unspecified: Secondary | ICD-10-CM | POA: Insufficient documentation

## 2020-04-29 HISTORY — DX: Other psychoactive substance abuse, uncomplicated: F19.10

## 2020-04-29 LAB — CBC
HCT: 48 % (ref 39.0–52.0)
Hemoglobin: 16.4 g/dL (ref 13.0–17.0)
MCH: 29.8 pg (ref 26.0–34.0)
MCHC: 34.2 g/dL (ref 30.0–36.0)
MCV: 87.3 fL (ref 80.0–100.0)
Platelets: 295 10*3/uL (ref 150–400)
RBC: 5.5 MIL/uL (ref 4.22–5.81)
RDW: 12.3 % (ref 11.5–15.5)
WBC: 12.2 10*3/uL — ABNORMAL HIGH (ref 4.0–10.5)
nRBC: 0 % (ref 0.0–0.2)

## 2020-04-29 LAB — URINALYSIS, ROUTINE W REFLEX MICROSCOPIC
Bilirubin Urine: NEGATIVE
Glucose, UA: NEGATIVE mg/dL
Ketones, ur: NEGATIVE mg/dL
Leukocytes,Ua: NEGATIVE
Nitrite: NEGATIVE
Protein, ur: 30 mg/dL — AB
Specific Gravity, Urine: 1.029 (ref 1.005–1.030)
pH: 6 (ref 5.0–8.0)

## 2020-04-29 LAB — COMPREHENSIVE METABOLIC PANEL
ALT: 25 U/L (ref 0–44)
AST: 22 U/L (ref 15–41)
Albumin: 4.8 g/dL (ref 3.5–5.0)
Alkaline Phosphatase: 42 U/L (ref 38–126)
Anion gap: 12 (ref 5–15)
BUN: 15 mg/dL (ref 6–20)
CO2: 26 mmol/L (ref 22–32)
Calcium: 9.5 mg/dL (ref 8.9–10.3)
Chloride: 101 mmol/L (ref 98–111)
Creatinine, Ser: 1.04 mg/dL (ref 0.61–1.24)
GFR, Estimated: >60 mL/min (ref >60)
Glucose, Bld: 117 mg/dL — ABNORMAL HIGH (ref 70–99)
Potassium: 3.1 mmol/L — ABNORMAL LOW (ref 3.5–5.1)
Sodium: 139 mmol/L (ref 135–145)
Total Bilirubin: 1.7 mg/dL — ABNORMAL HIGH (ref 0.3–1.2)
Total Protein: 7.4 g/dL (ref 6.5–8.1)

## 2020-04-29 LAB — LIPASE, BLOOD: Lipase: 36 U/L (ref 11–51)

## 2020-04-29 MED ORDER — LORAZEPAM 2 MG/ML IJ SOLN
0.5000 mg | Freq: Once | INTRAMUSCULAR | Status: AC
  Administered 2020-04-29: 0.5 mg via INTRAVENOUS
  Filled 2020-04-29: qty 1

## 2020-04-29 MED ORDER — PROMETHAZINE HCL 25 MG/ML IJ SOLN
25.0000 mg | Freq: Once | INTRAMUSCULAR | Status: AC
  Administered 2020-04-29: 25 mg via INTRAMUSCULAR
  Filled 2020-04-29: qty 1

## 2020-04-29 MED ORDER — ONDANSETRON HCL 4 MG/2ML IJ SOLN
4.0000 mg | Freq: Once | INTRAMUSCULAR | Status: AC
  Administered 2020-04-29: 4 mg via INTRAVENOUS
  Filled 2020-04-29: qty 2

## 2020-04-29 MED ORDER — SODIUM CHLORIDE 0.9 % IV BOLUS
1000.0000 mL | Freq: Once | INTRAVENOUS | Status: AC
  Administered 2020-04-29: 1000 mL via INTRAVENOUS

## 2020-04-29 MED ORDER — PROMETHAZINE HCL 25 MG PO TABS: 25.0000 mg | ORAL_TABLET | Freq: Four times a day (QID) | ORAL | 0 refills | Status: AC | PRN

## 2020-04-29 NOTE — ED Triage Notes (Signed)
Pt presents to ED with complaints of vomiting x 2 days and abdominal soreness. Pt also states had some diarrhea.

## 2020-04-29 NOTE — Discharge Instructions (Addendum)
Please read instructions below. Drink clear liquids until your stomach feels better. Then, slowly introduce bland foods into your diet as tolerated, such as bread, rice, apples, bananas. You can take imodium as needed for diarrhea. You can find this on the shelf at any pharmacy. You can take promethazine/phenergan every 6-8 hours as needed for nausea. If this makes you too drowsy, you can also break the tablet in half and take less. Follow up with your primary care. Return to the ER for severe abdominal pain, fever, uncontrollable vomiting, or new or concerning symptoms.

## 2020-04-29 NOTE — ED Provider Notes (Signed)
[facility] EMERGENCY DEPARTMENT Provider Note   CSN: [id number] Arrival date & time: 04/29/20  1129     History Chief Complaint  Patient presents with  . Emesis    Duane Parker is a 29 y.o. male w PMHx anxiety, presenting to the ED with complaint of 1 day of nausea and vomiting.  Patient states symptoms began yesterday morning with nausea and nonbloody nonbilious emesis.  He states he is having trouble keeping anything down therefore presents for evaluation.  He has had a few episodes of diarrhea.  He states his temperature last night was as high as 99.5 F.  He endorses some soreness to his abdomen from the vomiting though no abdominal pain.  Denies cough or congestion, urinary frequency or dysuria.  No known Covid exposures or sick contacts.  No history of abdominal surgeries.  He states he has used marijuana though multiple weeks ago.  He took ibuprofen last night for his symptoms with some moderate relief throughout the night.  He states he has quite a bit of anxiety at baseline though somewhat worsening given current illness.  He has had some vomiting from his anxiety in the past, however today feels different and does not believe it is related to his anxiety. It is however aggravating his anxiety.  The history is provided by the patient.       Past Medical History:  Diagnosis Date  . Anxiety   . Depression   . Environmental allergies   . Substance abuse (HCC)     There are no problems to display for this patient.   History reviewed. No pertinent surgical history.     No family history on file.  Social History   Tobacco Use  . Smoking status: Current Every Day Smoker    Types: Cigarettes  . Smokeless tobacco: Never Used  Substance Use Topics  . Alcohol use: Yes    Comment: occ  . Drug use: Not Currently    Home Medications Prior to Admission medications   Medication Sig Start Date End Date Taking? Authorizing Provider  cetirizine-pseudoephedrine  (ZYRTEC-D) 5-120 MG per tablet Take 1 tablet by mouth 2 (two) times daily as needed for allergies. Patient not taking: Reported on 06/30/2014 01/22/14   Burgess Amor, PA-C  ibuprofen (ADVIL,MOTRIN) 200 MG tablet Take 400 mg by mouth every 8 (eight) hours as needed for fever or mild pain. For pain    [provider]  ondansetron (ZOFRAN) 4 MG tablet Take 1 tablet (4 mg total) by mouth every 6 (six) hours. 06/30/14   Janne Napoleon, NP  oxymetazoline (AFRIN) 0.05 % nasal spray Place 1 spray into both nostrils daily as needed for congestion.    [provider]  promethazine (PHENERGAN) 25 MG tablet Take 1 tablet (25 mg total) by mouth every 6 (six) hours as needed for nausea or vomiting. 04/29/20   Olumide Dolinger, [place] N, PA-C  venlafaxine XR (EFFEXOR-XR) 75 MG 24 hr capsule Take 1 capsule (75 mg total) by mouth daily with breakfast. 08/22/18   Triplett, Tammy, PA-C    Allergies    Patient has no known allergies.  Review of Systems   Review of Systems  Constitutional: Negative for chills and fever.  Gastrointestinal: Positive for diarrhea, nausea and vomiting. Negative for abdominal pain.  Genitourinary: Negative for dysuria and frequency.  Psychiatric/Behavioral: The patient is nervous/anxious.   All other systems reviewed and are negative.   Physical Exam Updated Vital Signs BP 129/86   Pulse 71  Temp 98.4 F (36.9 C) (Oral)   Resp 16   Ht 6\' 2"  (1.88 m)   Wt 77.1 kg   SpO2 100%   BMI 21.83 kg/m   Physical Exam Vitals and nursing note reviewed.  Constitutional:      General: He is not in acute distress.    Appearance: He is well-developed. He is not ill-appearing.  HENT:     Head: Normocephalic and atraumatic.  Eyes:     Conjunctiva/sclera: Conjunctivae normal.  Cardiovascular:     Rate and Rhythm: Normal rate and regular rhythm.  Pulmonary:     Effort: Pulmonary effort is normal. No respiratory distress.     Breath sounds: Normal breath sounds.  Abdominal:      General: Bowel sounds are normal.     Palpations: Abdomen is soft.     Tenderness: There is no abdominal tenderness. There is no guarding or rebound.  Skin:    General: Skin is warm.  Neurological:     Mental Status: He is alert.  Psychiatric:        Behavior: Behavior normal.     ED Results / Procedures / Treatments   Labs (all labs ordered are listed, but only abnormal results are displayed) Labs Reviewed  COMPREHENSIVE METABOLIC PANEL - Abnormal; Notable for the following components:      Result Value   Potassium 3.1 (*)    Glucose, Bld 117 (*)    Total Bilirubin 1.7 (*)    All other components within normal limits  CBC - Abnormal; Notable for the following components:   WBC 12.2 (*)    All other components within normal limits  URINALYSIS, ROUTINE W REFLEX MICROSCOPIC - Abnormal; Notable for the following components:   Color, Urine AMBER (*)    APPearance HAZY (*)    Hgb urine dipstick SMALL (*)    Protein, ur 30 (*)    Bacteria, UA RARE (*)    All other components within normal limits  LIPASE, BLOOD    EKG None  Radiology No results found.  Procedures Procedures (including critical care time)  Medications Ordered in ED Medications  ondansetron (ZOFRAN) injection 4 mg (4 mg Intravenous Given 04/29/20 1416)  sodium chloride 0.9 % bolus 1,000 mL (0 mLs Intravenous Stopped 04/29/20 1533)  LORazepam (ATIVAN) injection 0.5 mg (0.5 mg Intravenous Given 04/29/20 1416)  promethazine (PHENERGAN) injection 25 mg (25 mg Intramuscular Given 04/29/20 1533)    ED Course  I have reviewed the triage vital signs and the nursing notes.  Pertinent labs & imaging results that were available during my care of the patient were reviewed by me and considered in my medical decision making (see chart for details).  Clinical Course as of Apr 29 1734  [facility] Apr 29, 2020  1514 Pt reports overall improvement in symptoms. Does still feel somewhat nauseous, will redose antiemetic and PO  challenge   [JR]    Clinical Course User Index [JR] Haya Hemler, May 01, 2020 N, PA-C   MDM Rules/Calculators/A&P                          Patient presenting with 1 day of N/V with a few episodes of diarrhea. Reports some soreness in his abdomen from vomiting, however no abdominal pain.  No fevers, URI symptoms or urinary symptoms.  On arrival he is afebrile with stable vital signs.  Abdomen is soft and nontender.  Labs are reassuring, slight leukocytosis of 12. Mild hypokalemia.  Treated with IV fluids, antiemetics.  He is also given small dose of Ativan for his chronic anxiety that is worsened while being here in the ED.   Tolerating PO liquids with symptom improvement on re-evaluation. Suspect presentation is likely viral in nature.  Low suspicion for emergent cause of patient's symptoms given benign abdominal exam, normal vital signs and reassuring blood work.  Will discharge with symptomatic management and instructed follow-up with PCP.  Return precautions discussed, including fever, severe abdominal pain, uncontrollable vomiting.  Patient is well-appearing and agreeable to plan.  Patient is discharged to home.    Final Clinical Impression(s) / ED Diagnoses Final diagnoses:  Nausea vomiting and diarrhea    Rx / DC Orders ED Discharge Orders         Ordered    promethazine (PHENERGAN) 25 MG tablet  Every 6 hours PRN        04/29/20 1606           Terrah Decoster, [place] N, [place] 04/29/20 1735    Jacalyn Lefevre, MD 05/09/20 [phone]

## 2020-05-06 ENCOUNTER — Encounter (HOSPITAL_COMMUNITY): Payer: Self-pay | Admitting: *Deleted

## 2020-05-06 ENCOUNTER — Other Ambulatory Visit: Payer: Self-pay

## 2020-05-06 ENCOUNTER — Emergency Department (HOSPITAL_COMMUNITY)
Admission: EM | Admit: 2020-05-06 | Discharge: 2020-05-06 | Disposition: A | Discharge status: Home / Self Care | Payer: Self-pay | Attending: Emergency Medicine | Admitting: Emergency Medicine

## 2020-05-06 DIAGNOSIS — F419 Anxiety disorder, unspecified: Secondary | ICD-10-CM | POA: Insufficient documentation

## 2020-05-06 DIAGNOSIS — Z79899 Other long term (current) drug therapy: Secondary | ICD-10-CM | POA: Insufficient documentation

## 2020-05-06 DIAGNOSIS — F17210 Nicotine dependence, cigarettes, uncomplicated: Secondary | ICD-10-CM | POA: Insufficient documentation

## 2020-05-06 MED ORDER — HYDROXYZINE HCL 25 MG PO TABS: 25.0000 mg | ORAL_TABLET | Freq: Four times a day (QID) | ORAL | 0 refills | Status: AC

## 2020-05-06 MED ORDER — HYDROXYZINE HCL 25 MG PO TABS
50.0000 mg | ORAL_TABLET | Freq: Once | ORAL | Status: AC
  Administered 2020-05-06: 50 mg via ORAL
  Filled 2020-05-06: qty 2

## 2020-05-06 NOTE — ED Provider Notes (Signed)
[facility] EMERGENCY DEPARTMENT Provider Note   CSN: [id number] Arrival date & time: 05/06/20  1841     History Chief Complaint  Patient presents with  . Anxiety    Duane Parker is a 29 y.o. male.  HPI      Duane Parker is a 29 y.o. male, with a history of anxiety, depression, substance abuse, presenting to the ED with feelings of anxiety beginning today following increased stress.  He has had similar feelings in the past.  He has previously been under the care of a doctor for these symptoms, but has not followed up "in a while." He endorses polysubstance abuse, but denies any substance abuse today. Denies SI/HI. Denies fever/chills, chest pain, shortness of breath, cough, lower extremity pain/edema, N/V/D, urinary symptoms, or any other complaints.    Past Medical History:  Diagnosis Date  . Anxiety   . Depression   . Environmental allergies   . Substance abuse (HCC)     There are no problems to display for this patient.   History reviewed. No pertinent surgical history.     No family history on file.  Social History   Tobacco Use  . Smoking status: Current Every Day Smoker    Packs/day: 0.50    Types: Cigarettes  . Smokeless tobacco: Never Used  Vaping Use  . Vaping Use: Never used  Substance Use Topics  . Alcohol use: Not Currently    Comment: occ  . Drug use: Not Currently    Home Medications Prior to Admission medications   Medication Sig Start Date End Date Taking? Authorizing Provider  cetirizine-pseudoephedrine (ZYRTEC-D) 5-120 MG per tablet Take 1 tablet by mouth 2 (two) times daily as needed for allergies. Patient not taking: Reported on 06/30/2014 01/22/14   Burgess Amor, PA-C  hydrOXYzine (ATARAX/VISTARIL) 25 MG tablet Take 1 tablet (25 mg total) by mouth every 6 (six) hours. 05/06/20   Mosie Angus C, PA-C  ibuprofen (ADVIL,MOTRIN) 200 MG tablet Take 400 mg by mouth every 8 (eight) hours as needed for fever or mild pain. For pain     [provider]  ondansetron (ZOFRAN) 4 MG tablet Take 1 tablet (4 mg total) by mouth every 6 (six) hours. 06/30/14   Janne Napoleon, NP  oxymetazoline (AFRIN) 0.05 % nasal spray Place 1 spray into both nostrils daily as needed for congestion.    [provider]  promethazine (PHENERGAN) 25 MG tablet Take 1 tablet (25 mg total) by mouth every 6 (six) hours as needed for nausea or vomiting. 04/29/20   Robinson, [place] N, PA-C  venlafaxine XR (EFFEXOR-XR) 75 MG 24 hr capsule Take 1 capsule (75 mg total) by mouth daily with breakfast. 08/22/18   Triplett, Tammy, PA-C    Allergies    Patient has no known allergies.  Review of Systems   Review of Systems  Constitutional: Negative for chills, diaphoresis and fever.  Respiratory: Negative for cough and shortness of breath.   Cardiovascular: Negative for chest pain and leg swelling.  Gastrointestinal: Negative for abdominal pain, diarrhea, nausea and vomiting.  Neurological: Negative for dizziness, syncope, weakness and numbness.  Psychiatric/Behavioral: The patient is nervous/anxious.   All other systems reviewed and are negative.   Physical Exam Updated Vital Signs BP (!) 146/95   Pulse (!) 103   Temp 98.8 F (37.1 C) (Oral)   Resp 14   Ht 6\' 2"  (1.88 m)   Wt 77.1 kg   SpO2 100%   BMI 21.83  kg/m   Physical Exam Vitals and nursing note reviewed.  Constitutional:      General: He is not in acute distress.    Appearance: He is well-developed. He is not diaphoretic.  HENT:     Head: Normocephalic and atraumatic.     Mouth/Throat:     Mouth: Mucous membranes are moist.     Pharynx: Oropharynx is clear.  Eyes:     Conjunctiva/sclera: Conjunctivae normal.  Cardiovascular:     Rate and Rhythm: Normal rate and regular rhythm.     Pulses: Normal pulses.          Radial pulses are 2+ on the right side and 2+ on the left side.       Posterior tibial pulses are 2+ on the right side and 2+ on the left side.     Heart  sounds: Normal heart sounds.     Comments: Tactile temperature in the extremities appropriate and equal bilaterally. Patient was noted to have a normal pulse on the monitor as I walked into the room, however, as I began to speak with him, his pulse rose and he seemed to be more nervous. Pulmonary:     Effort: Pulmonary effort is normal. No respiratory distress.     Breath sounds: Normal breath sounds.  Abdominal:     Palpations: Abdomen is soft.     Tenderness: There is no abdominal tenderness. There is no guarding.  Musculoskeletal:     Cervical back: Neck supple.     Right lower leg: No edema.     Left lower leg: No edema.  Skin:    General: Skin is warm and dry.  Neurological:     Mental Status: He is alert.  Psychiatric:        Mood and Affect: Mood and affect normal.        Speech: Speech normal.        Behavior: Behavior normal.     ED Results / Procedures / Treatments   Labs (all labs ordered are listed, but only abnormal results are displayed) Labs Reviewed - No data to display  EKG None  Radiology No results found.  Procedures Procedures (including critical care time)  Medications Ordered in ED Medications  hydrOXYzine (ATARAX/VISTARIL) tablet 50 mg (has no administration in time range)    ED Course  I have reviewed the triage vital signs and the nursing notes.  Pertinent labs & imaging results that were available during my care of the patient were reviewed by me and considered in my medical decision making (see chart for details).    MDM Rules/Calculators/A&P                          Patient presents with feelings of anxiety. We discussed options for medication.  He states he has the ability for follow-up in the office with a PCP and was encouraged to do so. We discussed the crisis plan for SI.  Patient voiced understanding of the plan and is in full agreement.   Final Clinical Impression(s) / ED Diagnoses Final diagnoses:  Anxious mood    Rx /  DC Orders ED Discharge Orders         Ordered    hydrOXYzine (ATARAX/VISTARIL) 25 MG tablet  Every 6 hours        05/06/20 2059           Concepcion Living 05/06/20 2111    Vanetta Mulders, MD  05/21/20 1511  

## 2020-05-06 NOTE — ED Notes (Signed)
Pt says he has a lot of anxiety and that he has been off his clonazepam.

## 2020-05-06 NOTE — ED Notes (Signed)
ED Provider at bedside. 

## 2020-05-06 NOTE — Discharge Instructions (Addendum)
°  Hydroxyzine: May use the hydroxyzine, as needed, for anxiety.  Use caution as hydroxyzine can make you drowsy.  Follow-up with a primary care provider or mental health specialist on this matter.  Return to the emergency department for chest pain, worsening difficulty breathing, suicidal thoughts, thoughts of hurting others, or thoughts of self-harm.

## 2020-05-06 NOTE — ED Triage Notes (Signed)
Pt c/o anxiety that has worsened over the last 2 days. Pt c/o chest tightness that started today.

## 2020-05-06 NOTE — ED Notes (Signed)
Vital signs stable. 

## 2022-03-24 DIAGNOSIS — R69 Illness, unspecified: Secondary | ICD-10-CM | POA: Diagnosis not present

## 2022-03-25 DIAGNOSIS — F419 Anxiety disorder, unspecified: Secondary | ICD-10-CM | POA: Diagnosis not present

## 2022-03-25 DIAGNOSIS — R69 Illness, unspecified: Secondary | ICD-10-CM | POA: Diagnosis not present

## 2022-03-30 DIAGNOSIS — R69 Illness, unspecified: Secondary | ICD-10-CM | POA: Diagnosis not present

## 2022-04-01 DIAGNOSIS — R69 Illness, unspecified: Secondary | ICD-10-CM | POA: Diagnosis not present

## 2022-04-01 DIAGNOSIS — F419 Anxiety disorder, unspecified: Secondary | ICD-10-CM | POA: Diagnosis not present

## 2022-04-06 DIAGNOSIS — R69 Illness, unspecified: Secondary | ICD-10-CM | POA: Diagnosis not present

## 2022-04-13 DIAGNOSIS — R69 Illness, unspecified: Secondary | ICD-10-CM | POA: Diagnosis not present

## 2022-04-20 DIAGNOSIS — R69 Illness, unspecified: Secondary | ICD-10-CM | POA: Diagnosis not present

## 2022-04-27 DIAGNOSIS — R69 Illness, unspecified: Secondary | ICD-10-CM | POA: Diagnosis not present

## 2022-04-29 DIAGNOSIS — F419 Anxiety disorder, unspecified: Secondary | ICD-10-CM | POA: Diagnosis not present

## 2022-04-29 DIAGNOSIS — R69 Illness, unspecified: Secondary | ICD-10-CM | POA: Diagnosis not present

## 2022-05-04 DIAGNOSIS — R69 Illness, unspecified: Secondary | ICD-10-CM | POA: Diagnosis not present

## 2022-05-04 DIAGNOSIS — F419 Anxiety disorder, unspecified: Secondary | ICD-10-CM | POA: Diagnosis not present

## 2022-05-11 DIAGNOSIS — R69 Illness, unspecified: Secondary | ICD-10-CM | POA: Diagnosis not present

## 2022-05-18 DIAGNOSIS — R69 Illness, unspecified: Secondary | ICD-10-CM | POA: Diagnosis not present

## 2022-05-25 DIAGNOSIS — R69 Illness, unspecified: Secondary | ICD-10-CM | POA: Diagnosis not present

## 2022-05-27 DIAGNOSIS — F419 Anxiety disorder, unspecified: Secondary | ICD-10-CM | POA: Diagnosis not present

## 2022-05-27 DIAGNOSIS — R69 Illness, unspecified: Secondary | ICD-10-CM | POA: Diagnosis not present

## 2022-06-01 DIAGNOSIS — R69 Illness, unspecified: Secondary | ICD-10-CM | POA: Diagnosis not present

## 2022-06-10 DIAGNOSIS — R69 Illness, unspecified: Secondary | ICD-10-CM | POA: Diagnosis not present

## 2022-06-17 DIAGNOSIS — R69 Illness, unspecified: Secondary | ICD-10-CM | POA: Diagnosis not present

## 2022-06-24 DIAGNOSIS — F419 Anxiety disorder, unspecified: Secondary | ICD-10-CM | POA: Diagnosis not present

## 2022-06-24 DIAGNOSIS — R69 Illness, unspecified: Secondary | ICD-10-CM | POA: Diagnosis not present

## 2022-06-24 DIAGNOSIS — K047 Periapical abscess without sinus: Secondary | ICD-10-CM | POA: Diagnosis not present

## 2022-07-01 DIAGNOSIS — R69 Illness, unspecified: Secondary | ICD-10-CM | POA: Diagnosis not present

## 2022-07-01 DIAGNOSIS — K047 Periapical abscess without sinus: Secondary | ICD-10-CM | POA: Diagnosis not present

## 2022-07-08 DIAGNOSIS — F17200 Nicotine dependence, unspecified, uncomplicated: Secondary | ICD-10-CM | POA: Diagnosis not present

## 2022-07-08 DIAGNOSIS — K047 Periapical abscess without sinus: Secondary | ICD-10-CM | POA: Diagnosis not present

## 2022-07-08 DIAGNOSIS — F1320 Sedative, hypnotic or anxiolytic dependence, uncomplicated: Secondary | ICD-10-CM | POA: Diagnosis not present

## 2022-07-08 DIAGNOSIS — R69 Illness, unspecified: Secondary | ICD-10-CM | POA: Diagnosis not present

## 2022-07-08 DIAGNOSIS — F1290 Cannabis use, unspecified, uncomplicated: Secondary | ICD-10-CM | POA: Diagnosis not present

## 2022-07-15 DIAGNOSIS — F1320 Sedative, hypnotic or anxiolytic dependence, uncomplicated: Secondary | ICD-10-CM | POA: Diagnosis not present

## 2022-07-15 DIAGNOSIS — F1290 Cannabis use, unspecified, uncomplicated: Secondary | ICD-10-CM | POA: Diagnosis not present

## 2022-07-15 DIAGNOSIS — F17200 Nicotine dependence, unspecified, uncomplicated: Secondary | ICD-10-CM | POA: Diagnosis not present

## 2022-07-15 DIAGNOSIS — R69 Illness, unspecified: Secondary | ICD-10-CM | POA: Diagnosis not present

## 2022-07-15 DIAGNOSIS — K047 Periapical abscess without sinus: Secondary | ICD-10-CM | POA: Diagnosis not present

## 2022-07-22 DIAGNOSIS — F1120 Opioid dependence, uncomplicated: Secondary | ICD-10-CM | POA: Diagnosis not present

## 2022-07-23 DIAGNOSIS — F1120 Opioid dependence, uncomplicated: Secondary | ICD-10-CM | POA: Diagnosis not present

## 2022-07-29 DIAGNOSIS — F1120 Opioid dependence, uncomplicated: Secondary | ICD-10-CM | POA: Diagnosis not present

## 2022-08-10 DIAGNOSIS — F1290 Cannabis use, unspecified, uncomplicated: Secondary | ICD-10-CM | POA: Diagnosis not present

## 2022-08-10 DIAGNOSIS — F419 Anxiety disorder, unspecified: Secondary | ICD-10-CM | POA: Diagnosis not present

## 2022-08-10 DIAGNOSIS — F1320 Sedative, hypnotic or anxiolytic dependence, uncomplicated: Secondary | ICD-10-CM | POA: Diagnosis not present

## 2022-08-10 DIAGNOSIS — F1120 Opioid dependence, uncomplicated: Secondary | ICD-10-CM | POA: Diagnosis not present

## 2022-08-12 DIAGNOSIS — K047 Periapical abscess without sinus: Secondary | ICD-10-CM | POA: Diagnosis not present

## 2022-08-12 DIAGNOSIS — F1320 Sedative, hypnotic or anxiolytic dependence, uncomplicated: Secondary | ICD-10-CM | POA: Diagnosis not present

## 2022-08-12 DIAGNOSIS — F17200 Nicotine dependence, unspecified, uncomplicated: Secondary | ICD-10-CM | POA: Diagnosis not present

## 2022-08-12 DIAGNOSIS — F1120 Opioid dependence, uncomplicated: Secondary | ICD-10-CM | POA: Diagnosis not present

## 2022-08-12 DIAGNOSIS — F419 Anxiety disorder, unspecified: Secondary | ICD-10-CM | POA: Diagnosis not present

## 2022-08-12 DIAGNOSIS — F1290 Cannabis use, unspecified, uncomplicated: Secondary | ICD-10-CM | POA: Diagnosis not present

## 2023-02-10 ENCOUNTER — Telehealth: Payer: Self-pay

## 2023-02-10 NOTE — Telephone Encounter (Signed)
Attempted to reach newly enrolled [facility] uninsured pt to schedule a PCP appointment to the [facility] as requested, however pt was unavailable at the time of phone call.   Will attempt to try again on 8.28.24, but will also send a followup text message to assist with getting in contact with pt.

## 2023-05-01 ENCOUNTER — Encounter (HOSPITAL_COMMUNITY): Payer: Self-pay

## 2023-05-01 ENCOUNTER — Other Ambulatory Visit: Payer: Self-pay

## 2023-05-01 ENCOUNTER — Emergency Department (HOSPITAL_COMMUNITY)
Admission: EM | Admit: 2023-05-01 | Discharge: 2023-05-01 | Disposition: A | Discharge status: Home / Self Care | Payer: Medicaid Other | Attending: Emergency Medicine | Admitting: Emergency Medicine

## 2023-05-01 DIAGNOSIS — G510 Bell's palsy: Secondary | ICD-10-CM | POA: Diagnosis present

## 2023-05-01 MED ORDER — PREDNISONE 20 MG PO TABS: 60.0000 mg | ORAL_TABLET | Freq: Every day | ORAL | 0 refills | Status: AC

## 2023-05-01 MED ORDER — VALACYCLOVIR HCL 1 G PO TABS: 1000.0000 mg | ORAL_TABLET | Freq: Three times a day (TID) | ORAL | 0 refills | Status: AC

## 2023-05-01 NOTE — ED Triage Notes (Signed)
Pt reports he has been having his right eye watering and his eyebrow won't raise as much as his other.  Pt reports he saw Dr. Cathey Endow that does his suboxone treatment on Tuesday and she told him she thought he had bell's Palsy and she gave him an Rx for prednisone but his mother wanted him to be checked in the ER.

## 2023-05-01 NOTE — Discharge Instructions (Signed)
Your exam is with a Bell palsy, please see the attached instructions, make sure you take the prednisone and the Valtrex exactly as prescribed and see your doctor within 1 week for recheck.  If things or not getting better they may need to do more testing, if you develop severe or worsening weakness numbness difficulty with vision or speech you will need to be seen in the ER immediately.  I would like for you to get a bottle of artificial tears and use this in your right eye to help keep it lubricated, this will help prevent injuries to your eye.  I would strongly recommend that you see your doctor within 1 week for recheck to make sure things going well and improving  [facility] Doctor List    Syliva Overman, MD. Specialty: [facility] Medicine Contact information: [address]  [place] [address]  [phone]   Lilyan Punt, MD. Specialty: [facility] Medicine Contact information: [address]  [address]  [place] [address]  [phone]   Avon Gully, MD Specialty: Internal Medicine Contact information: [address] [place] [address]  [phone]   Catalina Pizza, MD. Specialty: Internal Medicine Contact information: [address]  [place] [address]  [phone]    [facility] (Dr. Selena Batten) Specialty: Family Medicine Contact information: [address]  [place] [address]  [phone]   John Giovanni, MD. Specialty: [facility] Medicine Contact information: [address]  [address]  [place] [address]  [phone]   Carylon Perches, MD. Specialty: Internal Medicine Contact information: [address]  [address]  [place] [address]  [phone]   [facility] Family Medicine: [address] [phone]  [facility], Family medicine [address]  [phone]  [facility] [address] [place], [place] [phone]  [facility]: [address] [phone]    [facility]  [address] [place], [place] [address] [phone]  Services [facility] offers a variety of basic health services.  Services include but are not limited to: Blood pressure checks  Heart rate checks  Blood sugar checks  Urine analysis  Rapid strep tests  Pregnancy tests.  Health education and referrals  People needing more complex services will be directed to a physician online. Using these virtual visits, doctors can evaluate and prescribe medicine and treatments. There will be no medication on-site, though [facility] will help patients fill their prescriptions at little to no cost.   For More information please go to: [email or web address]  Allergy and Asthma:    [address] [facility] [address] [place] [facility] [phone]  Urology:  [address]  [place] [phone]  [facility]  [address] [place], [place] [phone]  Orthopedics   [address] [place], [place] [phone]  Endocrinology  [address] [place], [place] [phone]  Podiatry: [facility] [phone]

## 2023-05-01 NOTE — ED Provider Notes (Signed)
[place] EMERGENCY DEPARTMENT AT [facility] Provider Note   CSN: [id number] Arrival date & time: 05/01/23  1634     History  Chief Complaint  Patient presents with   Eye Problem    Duane Parker is a 32 y.o. male.   Eye Problem    This patient is a 32 year old male, he is on Suboxone therapy for history of prior opiate abuse, he has not used in many many months, he was at the Suboxone clinic a couple of days ago when he told the provider there that he was having some difficulty with his right eye, they noticed that he could not raise his right forehead or close his right eye and told him he had a Bell palsy, he thinks it started on Wednesday about 4 days ago.  The patient has not had any worsening symptoms but wanted to have a repeat exam because he was not sure he has not had a stroke or some other concerning finding.  He has no focal weakness of the arms or the legs, no numbness, no difficulty with speech or vision, he has no headache, no fevers or chills, no tick bites.  Home Medications Prior to Admission medications   Medication Sig Start Date End Date Taking? Authorizing Provider  predniSONE (DELTASONE) 20 MG tablet Take 3 tablets (60 mg total) by mouth daily for 7 days. 05/01/23 05/08/23 Yes Eber Hong, MD  valACYclovir (VALTREX) 1000 MG tablet Take 1 tablet (1,000 mg total) by mouth 3 (three) times daily for 7 days. 05/01/23 05/08/23 Yes Eber Hong, MD  cetirizine-pseudoephedrine (ZYRTEC-D) 5-120 MG per tablet Take 1 tablet by mouth 2 (two) times daily as needed for allergies. Patient not taking: Reported on 06/30/2014 01/22/14   Burgess Amor, PA-C  hydrOXYzine (ATARAX/VISTARIL) 25 MG tablet Take 1 tablet (25 mg total) by mouth every 6 (six) hours. 05/06/20   Joy, Shawn C, PA-C  ibuprofen (ADVIL,MOTRIN) 200 MG tablet Take 400 mg by mouth every 8 (eight) hours as needed for fever or mild pain. For pain    [provider]  ondansetron (ZOFRAN) 4 MG  tablet Take 1 tablet (4 mg total) by mouth every 6 (six) hours. 06/30/14   Janne Napoleon, NP  oxymetazoline (AFRIN) 0.05 % nasal spray Place 1 spray into both nostrils daily as needed for congestion.    [provider]  promethazine (PHENERGAN) 25 MG tablet Take 1 tablet (25 mg total) by mouth every 6 (six) hours as needed for nausea or vomiting. 04/29/20   Robinson, [place] N, PA-C  venlafaxine XR (EFFEXOR-XR) 75 MG 24 hr capsule Take 1 capsule (75 mg total) by mouth daily with breakfast. 08/22/18   Triplett, Tammy, PA-C      Allergies    Patient has no known allergies.    Review of Systems   Review of Systems  All other systems reviewed and are negative.   Physical Exam Updated Vital Signs BP 126/88 (BP Location: Right Arm)   Pulse 79   Temp 97.8 F (36.6 C)   Resp 17   Ht 1.88 m (6\' 2" )   Wt 77.1 kg   SpO2 100%   BMI 21.83 kg/m  Physical Exam Vitals and nursing note reviewed.  Constitutional:      General: He is not in acute distress.    Appearance: He is well-developed.  HENT:     Head: Normocephalic and atraumatic.     Mouth/Throat:     Pharynx: No oropharyngeal exudate.  Eyes:     General: No scleral icterus.       Right eye: No discharge.        Left eye: No discharge.     Conjunctiva/sclera: Conjunctivae normal.     Pupils: Pupils are equal, round, and reactive to light.  Neck:     Thyroid: No thyromegaly.     Vascular: No JVD.  Cardiovascular:     Rate and Rhythm: Normal rate and regular rhythm.     Heart sounds: Normal heart sounds. No murmur heard.    No friction rub. No gallop.  Pulmonary:     Effort: Pulmonary effort is normal. No respiratory distress.     Breath sounds: Normal breath sounds. No wheezing or rales.  Abdominal:     General: Bowel sounds are normal. There is no distension.     Palpations: Abdomen is soft. There is no mass.     Tenderness: There is no abdominal tenderness.  Musculoskeletal:        General: No tenderness. Normal  range of motion.     Cervical back: Normal range of motion and neck supple.     Right lower leg: No edema.     Left lower leg: No edema.  Lymphadenopathy:     Cervical: No cervical adenopathy.  Skin:    General: Skin is warm and dry.     Findings: No erythema or rash.  Neurological:     Mental Status: He is alert.     Coordination: Coordination normal.     Comments: Speech is clear, cranial nerves III through XII are intact, memory is intact, strength is normal in all 4 extremities including grips and strength at the bilateral thighs, knees and ankles to extention and flexion, sensation is intact to light touch and pinprick in all 4 extremities. Coordination as tested by finger-nose-finger is normal, no limb ataxia. Normal gait, normal reflexes at the patellar tendons bilaterally  Other than the above exam the focal abnormalities included right sided facial droop which is mild, inability to raise the right forehead or close the right eye, frequent blinking in the left eye  Psychiatric:        Behavior: Behavior normal.     ED Results / Procedures / Treatments   Labs (all labs ordered are listed, but only abnormal results are displayed) Labs Reviewed - No data to display  EKG None  Radiology No results found.  Procedures Procedures    Medications Ordered in ED Medications - No data to display  ED Course/ Medical Decision Making/ A&P                                 Medical Decision Making Risk Prescription drug management.   Exam is consistent with a Bell palsy, vitals are unremarkable, will treat with Valtrex and higher dose prednisone, gave family doctor list for follow-up, suspect benign process but will need further testing if no improvement within 1 week.  Patient agreeable, mother at the bedside, all questions answered.        Final Clinical Impression(s) / ED Diagnoses Final diagnoses:  Bell palsy    Rx / DC Orders ED Discharge Orders           Ordered    predniSONE (DELTASONE) 20 MG tablet  Daily        05/01/23 1713    valACYclovir (VALTREX) 1000 MG tablet  3 times daily  05/01/23 1713              Eber Hong, MD 05/01/23 [phone]
